# Patient Record
Sex: Male | Born: 1990 | Race: Black or African American | Hispanic: No | Smoking: Current every day smoker
Health system: Southern US, Community
[De-identification: ages and names within clinical notes are randomized; demographics above are authoritative.]

## PROBLEM LIST (undated history)

## (undated) DIAGNOSIS — J42 Unspecified chronic bronchitis: Secondary | ICD-10-CM

## (undated) DIAGNOSIS — R51 Headache: Secondary | ICD-10-CM

## (undated) DIAGNOSIS — F329 Major depressive disorder, single episode, unspecified: Secondary | ICD-10-CM

## (undated) DIAGNOSIS — F32A Depression, unspecified: Secondary | ICD-10-CM

## (undated) DIAGNOSIS — J40 Bronchitis, not specified as acute or chronic: Secondary | ICD-10-CM

## (undated) DIAGNOSIS — G43909 Migraine, unspecified, not intractable, without status migrainosus: Secondary | ICD-10-CM

## (undated) DIAGNOSIS — F4310 Post-traumatic stress disorder, unspecified: Secondary | ICD-10-CM

## (undated) DIAGNOSIS — R55 Syncope and collapse: Secondary | ICD-10-CM

## (undated) DIAGNOSIS — R519 Headache, unspecified: Secondary | ICD-10-CM

## (undated) DIAGNOSIS — I10 Essential (primary) hypertension: Secondary | ICD-10-CM

## (undated) HISTORY — PX: TONSILLECTOMY: SUR1361

## (undated) HISTORY — DX: Syncope and collapse: R55

---
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
## 2009-02-11 ENCOUNTER — Emergency Department (HOSPITAL_COMMUNITY): Admission: EM | Admit: 2009-02-11 | Discharge: 2009-02-11 | Payer: Self-pay | Admitting: Emergency Medicine

## 2010-03-16 ENCOUNTER — Emergency Department (HOSPITAL_COMMUNITY): Admission: EM | Admit: 2010-03-16 | Discharge: 2010-03-16 | Payer: Self-pay | Admitting: Emergency Medicine

## 2010-07-24 ENCOUNTER — Emergency Department (HOSPITAL_COMMUNITY)
Admission: EM | Admit: 2010-07-24 | Discharge: 2010-07-24 | Payer: Self-pay | Source: Home / Self Care | Admitting: Emergency Medicine

## 2010-10-01 ENCOUNTER — Emergency Department (HOSPITAL_COMMUNITY)
Admission: EM | Admit: 2010-10-01 | Discharge: 2010-10-01 | Payer: PRIVATE HEALTH INSURANCE | Source: Home / Self Care | Admitting: Emergency Medicine

## 2010-10-09 ENCOUNTER — Emergency Department (HOSPITAL_COMMUNITY)
Admission: EM | Admit: 2010-10-09 | Discharge: 2010-10-09 | Payer: Self-pay | Source: Home / Self Care | Admitting: Emergency Medicine

## 2011-07-05 ENCOUNTER — Inpatient Hospital Stay (INDEPENDENT_AMBULATORY_CARE_PROVIDER_SITE_OTHER)
Admission: RE | Admit: 2011-07-05 | Discharge: 2011-07-05 | Disposition: A | Discharge status: Home / Self Care | Payer: Self-pay | Source: Ambulatory Visit | Attending: Family Medicine | Admitting: Family Medicine

## 2011-07-05 DIAGNOSIS — N342 Other urethritis: Secondary | ICD-10-CM

## 2011-07-07 LAB — GC/CHLAMYDIA PROBE AMP, GENITAL
Chlamydia, DNA Probe: NEGATIVE
GC Probe Amp, Genital: POSITIVE — AB

## 2011-09-20 ENCOUNTER — Encounter: Payer: Self-pay | Admitting: Emergency Medicine

## 2011-09-20 ENCOUNTER — Emergency Department (HOSPITAL_COMMUNITY)
Admission: EM | Admit: 2011-09-20 | Discharge: 2011-09-21 | Disposition: A | Discharge status: Home / Self Care | Payer: Self-pay | Attending: Emergency Medicine | Admitting: Emergency Medicine

## 2011-09-20 DIAGNOSIS — R509 Fever, unspecified: Secondary | ICD-10-CM | POA: Insufficient documentation

## 2011-09-20 DIAGNOSIS — J039 Acute tonsillitis, unspecified: Secondary | ICD-10-CM | POA: Insufficient documentation

## 2011-09-20 DIAGNOSIS — Z79899 Other long term (current) drug therapy: Secondary | ICD-10-CM | POA: Insufficient documentation

## 2011-09-20 DIAGNOSIS — R5381 Other malaise: Secondary | ICD-10-CM | POA: Insufficient documentation

## 2011-09-20 DIAGNOSIS — R112 Nausea with vomiting, unspecified: Secondary | ICD-10-CM | POA: Insufficient documentation

## 2011-09-20 DIAGNOSIS — IMO0001 Reserved for inherently not codable concepts without codable children: Secondary | ICD-10-CM | POA: Insufficient documentation

## 2011-09-20 DIAGNOSIS — R059 Cough, unspecified: Secondary | ICD-10-CM | POA: Insufficient documentation

## 2011-09-20 DIAGNOSIS — M542 Cervicalgia: Secondary | ICD-10-CM | POA: Insufficient documentation

## 2011-09-20 DIAGNOSIS — R05 Cough: Secondary | ICD-10-CM | POA: Insufficient documentation

## 2011-09-20 DIAGNOSIS — R070 Pain in throat: Secondary | ICD-10-CM | POA: Insufficient documentation

## 2011-09-20 DIAGNOSIS — F17200 Nicotine dependence, unspecified, uncomplicated: Secondary | ICD-10-CM | POA: Insufficient documentation

## 2011-09-20 DIAGNOSIS — R5383 Other fatigue: Secondary | ICD-10-CM | POA: Insufficient documentation

## 2011-09-20 HISTORY — DX: Bronchitis, not specified as acute or chronic: J40

## 2011-09-20 NOTE — ED Notes (Signed)
Patient wearing mask (droplet precautions).

## 2011-09-20 NOTE — ED Notes (Signed)
Gave report to Chris, RN.

## 2011-09-20 NOTE — ED Notes (Signed)
C/o sore throat, productive cough with whitish-red sputum, nausea, vomiting, generalized body aches, and fever since 11am.

## 2011-09-20 NOTE — ED Notes (Signed)
Patient complaining of sore throat, nasal congestion weakness, nausea/vomiting, chills, swollen lymph nodes, and fever that started today around 1100.  Patient states that he woke up this morning with a sore throat.  Patient states that he has vomited 6 times today and is unable to keep anything today; last emesis around 2200.  Patient states that he has not had his flu shot this year; denies every having the flu in the past.  Patient states that he has been diagnosed with strep throat in the past; states that symptoms feel similar to when he was diagnosed with strep throat. Patient alert and oriented x 4; PERRL present.  Family present at bedside.  Will continue to monitor.

## 2011-09-21 MED ORDER — IBUPROFEN 800 MG PO TABS: 800.0000 mg | ORAL_TABLET | Freq: Three times a day (TID) | ORAL | Status: AC | PRN

## 2011-09-21 MED ORDER — AMOXICILLIN 500 MG PO CAPS: 500.0000 mg | ORAL_CAPSULE | Freq: Three times a day (TID) | ORAL | Status: DC

## 2011-09-21 MED ORDER — IBUPROFEN 800 MG PO TABS
800.0000 mg | ORAL_TABLET | Freq: Once | ORAL | Status: AC
  Administered 2011-09-21: 800 mg via ORAL
  Filled 2011-09-21: qty 1

## 2011-09-21 MED ORDER — AMOXICILLIN 500 MG PO CAPS
500.0000 mg | ORAL_CAPSULE | Freq: Once | ORAL | Status: AC
  Administered 2011-09-21: 500 mg via ORAL
  Filled 2011-09-21: qty 1

## 2011-09-21 MED ORDER — AMOXICILLIN 500 MG PO CAPS: 500.0000 mg | ORAL_CAPSULE | Freq: Three times a day (TID) | ORAL | Status: AC

## 2011-09-21 NOTE — ED Notes (Signed)
Pt states understanding of discharge instruction 

## 2011-09-21 NOTE — ED Provider Notes (Signed)
History     CSN: [id number]  Arrival date & time 09/20/11  2132   First MD Initiated Contact with Patient 09/20/11 2341      Chief Complaint  Patient presents with  . Influenza    (Consider location/radiation/quality/duration/timing/severity/associated sxs/prior treatment) HPI Comments: Patient with onset of fever, sore throat, mild cough and nausea with one episode of vomiting this morning. Patient denies ear pain. He denies trouble swallowing or shortness of breath.  Patient is a 20 y.o. male presenting with pharyngitis. The history is provided by the patient.  Sore Throat This is a new problem. The current episode started today. Associated symptoms include coughing, fatigue, a fever, myalgias, nausea, neck pain, a sore throat and vomiting. Pertinent negatives include no abdominal pain, chest pain, chills, congestion, headaches or rash. The symptoms are aggravated by swallowing. He has tried acetaminophen for the symptoms. The treatment provided mild relief.    Past Medical History  Diagnosis Date  . Bronchitis     History reviewed. No pertinent past surgical history.  No family history on file.  History  Substance Use Topics  . Smoking status: Current Everyday Smoker  . Smokeless tobacco: Not on file  . Alcohol Use: Yes      Review of Systems  Constitutional: Positive for fever and fatigue. Negative for chills.  HENT: Positive for sore throat and neck pain. Negative for congestion and rhinorrhea.   Eyes: Negative for redness.  Respiratory: Positive for cough. Negative for shortness of breath.   Cardiovascular: Negative for chest pain.  Gastrointestinal: Positive for nausea and vomiting. Negative for abdominal pain, diarrhea and constipation.  Genitourinary: Negative for dysuria.  Musculoskeletal: Positive for myalgias.  Skin: Negative for rash.  Neurological: Negative for dizziness and headaches.    Allergies  Review of patient's allergies indicates no known  allergies.  Home Medications   Current Outpatient Rx  Name Route Sig Dispense Refill  . ACETAMINOPHEN 500 MG PO TABS Oral Take 500 mg by mouth every 6 (six) hours as needed. For pain     . AMOXICILLIN 500 MG PO CAPS Oral Take 1 capsule (500 mg total) by mouth 3 (three) times daily. 21 capsule 0  . IBUPROFEN 800 MG PO TABS Oral Take 1 tablet (800 mg total) by mouth every 8 (eight) hours as needed for pain. 15 tablet 0    BP 127/66  Pulse 102  Temp(Src) 100 F (37.8 C) (Oral)  Resp 18  SpO2 99%  Physical Exam  Nursing note and vitals reviewed. Constitutional: He is oriented to person, place, and time. He appears well-developed and well-nourished.  HENT:  Head: Normocephalic and atraumatic.  Right Ear: Tympanic membrane and external ear normal.  Left Ear: Tympanic membrane and external ear normal.  Nose: Nose normal.  Mouth/Throat: Mucous membranes are normal. No uvula swelling. Oropharyngeal exudate, posterior oropharyngeal edema and posterior oropharyngeal erythema present. No tonsillar abscesses.  Eyes: Conjunctivae are normal. Pupils are equal, round, and reactive to light. Right eye exhibits no discharge. Left eye exhibits no discharge.  Neck: Normal range of motion. Neck supple.  Cardiovascular: Normal rate, regular rhythm and normal heart sounds.   Pulmonary/Chest: Effort normal and breath sounds normal.  Abdominal: Soft. Bowel sounds are normal. There is no tenderness. There is no rebound and no guarding.  Musculoskeletal: He exhibits no edema.  Neurological: He is alert and oriented to person, place, and time.  Skin: Skin is warm and dry.  Psychiatric: He has a normal mood and affect.  ED Course  Procedures (including critical care time)  Labs Reviewed - No data to display No results found.   1. Tonsillitis   12:23 AM patient seen and examined. 4/4 Centor criteria met. Will treat with antibiotics and ibuprofen. Patient urged to return with worsening swelling,  trouble breathing, trouble swallowing, persistent fever, or any other concerns. Patient verbalizes understanding and agrees with plan. Antibiotics given in emergency department.  MDM  Four of four CENTOR criteria met (fever, cervical adenopathy, tonsillar/pharyngeal exudate, no/mild cough).  Antibiotics prescribed.          Eustace Moore [place], [place] 09/21/11 [phone]

## 2011-09-22 NOTE — ED Provider Notes (Signed)
Medical screening examination/treatment/procedure(s) were performed by non-physician practitioner and as supervising physician I was immediately available for consultation/collaboration.   Shanikka Wonders D Axyl Sitzman, MD 09/22/11 0534 

## 2012-02-29 ENCOUNTER — Encounter (HOSPITAL_COMMUNITY): Payer: Self-pay | Admitting: *Deleted

## 2012-02-29 ENCOUNTER — Emergency Department (HOSPITAL_COMMUNITY)
Admission: EM | Admit: 2012-02-29 | Discharge: 2012-02-29 | Disposition: A | Discharge status: Home / Self Care | Payer: Self-pay | Attending: Emergency Medicine | Admitting: Emergency Medicine

## 2012-02-29 DIAGNOSIS — F17200 Nicotine dependence, unspecified, uncomplicated: Secondary | ICD-10-CM | POA: Insufficient documentation

## 2012-02-29 DIAGNOSIS — L02219 Cutaneous abscess of trunk, unspecified: Secondary | ICD-10-CM

## 2012-02-29 DIAGNOSIS — B889 Infestation, unspecified: Secondary | ICD-10-CM

## 2012-02-29 DIAGNOSIS — L03115 Cellulitis of right lower limb: Secondary | ICD-10-CM

## 2012-02-29 DIAGNOSIS — L03319 Cellulitis of trunk, unspecified: Secondary | ICD-10-CM

## 2012-02-29 MED ORDER — SULFAMETHOXAZOLE-TRIMETHOPRIM 800-160 MG PO TABS: 1.0000 | ORAL_TABLET | Freq: Two times a day (BID) | ORAL | Status: AC

## 2012-02-29 MED ORDER — DIPHENHYDRAMINE HCL 25 MG PO CAPS
25.0000 mg | ORAL_CAPSULE | Freq: Once | ORAL | Status: AC
  Administered 2012-02-29: 25 mg via ORAL
  Filled 2012-02-29: qty 1

## 2012-02-29 MED ORDER — CEPHALEXIN 500 MG PO CAPS: 500.0000 mg | ORAL_CAPSULE | Freq: Four times a day (QID) | ORAL | Status: AC

## 2012-02-29 MED ORDER — PERMETHRIN 5 % EX CREA: TOPICAL_CREAM | CUTANEOUS | Status: AC

## 2012-02-29 MED ORDER — ACETAMINOPHEN 325 MG PO TABS
650.0000 mg | ORAL_TABLET | Freq: Once | ORAL | Status: AC
  Administered 2012-02-29: 650 mg via ORAL
  Filled 2012-02-29: qty 2

## 2012-02-29 NOTE — ED Notes (Signed)
EDP at BS 

## 2012-02-29 NOTE — ED Provider Notes (Signed)
History     CSN: [id number]  Arrival date & time 02/29/12  1951   First MD Initiated Contact with Patient 02/29/12 2029      Chief Complaint  Patient presents with  . Insect Bite    multiple    HPI Pt was seen at 2040.  Per pt, c/o gradual onset and persistence of constant "rash" with "itching all over" for the past several days.  States he was staying at a hotel before the "red spots all over" began.  Endorses several "red spots" have "come to a head" and "popped."  Denies fevers, no SOB/wheezing, no sore throat.      Past Medical History  Diagnosis Date  . Bronchitis     History reviewed. No pertinent past surgical history.   History  Substance Use Topics  . Smoking status: Current Everyday Smoker  . Smokeless tobacco: Not on file  . Alcohol Use: Yes    Review of Systems ROS: Statement: All systems negative except as marked or noted in the HPI; Constitutional: Negative for fever and chills. ; ; Eyes: Negative for eye pain, redness and discharge. ; ; ENMT: Negative for ear pain, hoarseness, nasal congestion, sinus pressure and sore throat. ; ; Cardiovascular: Negative for chest pain, palpitations, diaphoresis, dyspnea and peripheral edema. ; ; Respiratory: Negative for cough, wheezing and stridor. ; ; Gastrointestinal: Negative for nausea, vomiting, diarrhea, abdominal pain, blood in stool, hematemesis, jaundice and rectal bleeding. . ; ; Genitourinary: Negative for dysuria, flank pain and hematuria. ; ; Musculoskeletal: Negative for back pain and neck pain. Negative for swelling and trauma.; ; Skin: +rash, abscess, itching. Negative for abrasions, blisters, bruising and skin lesion.; ; Neuro: Negative for headache, lightheadedness and neck stiffness. Negative for weakness, altered level of consciousness , altered mental status, extremity weakness, paresthesias, involuntary movement, seizure and syncope.     Allergies  Review of patient's allergies indicates no known  allergies.  Home Medications  No current outpatient prescriptions on file.  BP 104/79  Pulse 85  Temp(Src) 98.4 F (36.9 C) (Oral)  Resp 19  SpO2 96%  Physical Exam 2045: Physical examination:  Nursing notes reviewed; Vital signs and O2 SAT reviewed;  Constitutional: Well developed, Well nourished, Well hydrated, In no acute distress; Head:  Normocephalic, atraumatic; Eyes: EOMI, PERRL; ENMT: Mouth and pharynx normal, Mucous membranes moist; Neck: Supple, Full range of motion; Cardiovascular: Regular rate and rhythm; Respiratory: Breath sounds clear and without wheezing, resps easy, Normal respiratory effort/excursion; Chest: Nontender, no deformity; Abdomen: Soft, Nondistended; Extremities: Pulses normal, No tenderness, No edema, No calf edema or asymmetry.; Neuro: AA&Ox3, Major CN grossly intact. Speech clear, normal coordination.  No gross focal motor or sensory deficits in extremities.; Skin: Color normal, Warm, Dry; +2 abscesses approx 4cm diameter each with surrounding erythema and induration to right side of torso/flank; +multiple small erythematous insect bites to arms and legs; +indurated and erythematous approx 4cm diameter area right lateral mid-tibia without central pointing area or fluctuance.    ED Course  Procedures    MDM  MDM Reviewed: nursing note and vitals     8:49 PM:  Pt refuses I&D of 2 abscesses on his right torso/flank: both are fluctuant and have central pointing areas that are spontaneously draining purulent fluid.  Right tibial erythematous area is not fluctuant, does not have a central pointing area.  Long d/w pt regarding abscess drainage.  Pt continues to refuse I&D, stating that he "just wants to try some abx first."  Pt  makes his own medical decisions.  Pt is agreeable to take both abx as rx, apply warm compresses to the areas of induration/abscess to promote further drainage, and return in 2 days for wound check; sooner if worsening.  Pt is also scratching  his arms and legs, has had exposure to possible infestation after staying at a hotel; will tx with scabicide.         Laray Anger, DO 03/04/12 1054

## 2012-02-29 NOTE — ED Notes (Addendum)
C/o multiple skin bites. Pinpoints to R flank, R index finger, R shin & L FA & hand. Mentions popping some of them, some drainage, itching, pain & redness. Was staying in a hotel, now at home. Denies fever. Mentions nausea.

## 2012-02-29 NOTE — Discharge Instructions (Signed)
RESOURCE GUIDE  Chronic Pain Problems: Contact [facility]  [phone] Patients need to be referred by their primary care doctor.  Insufficient Money for Medicine: Contact United Way:  call "211" or [facility] [phone].  No Primary Care Doctor: - Call [facility]  [phone] - can help you locate a primary care doctor that  accepts your insurance, provides certain services, etc. - Physician Referral Service- 1-800-533-3463  Agencies that provide inexpensive medical care: - [facility]  [phone] - [facility]  [phone] - [facility]  [phone] - [facility] Clinic  [phone] - Planned Parenthood  [phone] - [facility]  [phone]  Medicaid-accepting [place] Providers: - [facility]- [address]  [phone], Mon-Fri 9am-7pm, Sat 9am-1pm - [facility]- [address], [address]  [phone] - [facility]- [address], [address]  [phone] - [facility]- [address] [place] [address]  [phone] - Ve[facility]- [address], [address], [phone]  Only accepts [place] Access Medicaid patients after they have their name  applied to their card  Self Pay (no insurance) in [place]: - Sickle Cell Patients: Dr Eric Dean, [facility]  [address], [phone] - [facility]- [address]  [phone]       -     [facility] [place]- [address]       -     [facility]- see information above (Speak to Pam H if you do not have insurance)       -  [facility]- [address], [phone]       -  [facility] [place]- [address],  [phone]       -  [facility]- [address] [place] [address], [phone]       -  Dr Osei-Bonsu-  [address], [place], [phone]       -  [facility]- [address], [phone]       -  [facility]- [address] [place] [address], [phone], also [address], [phone]       -    [facility]- [address], [phone], 1st & 3rd Saturday   every month, 10am-1pm  1) Find a Doctor and Pay Out of Pocket Although you won't have to find out who is covered by your insurance plan, it is a good idea to ask around and get recommendations. You will then need to call the office and see if the doctor you have chosen will accept you as a new patient and what types of options they offer for patients who are self-pay. Some doctors offer discounts or will set up payment plans for their patients who do not have insurance, but you will need to ask so you aren't surprised when you get to your appointment.  2) Contact Your Local Health Department Not all health departments have doctors that can see patients for sick visits, but many do, so it is worth a call to see if yours does. If you don't know where your local health department is, you can check in your phone book. The CDC also has a tool to help you locate your state's health department, and many state websites also have   listings of all of their local health departments.  3) Find a Walk-in Clinic If your illness is not likely to be very severe or complicated, you may want to try a walk in clinic. These are popping up all over the country in pharmacies, drugstores, and shopping centers. They're usually staffed by nurse practitioners or physician assistants that have been trained to treat common illnesses and complaints. They're usually fairly quick and inexpensive. However, if you have serious medical issues or chronic medical problems, these are probably not your best option  STD Testing - [place] [facility] [place], [facility] Clinic, [address], [place], phone [phone] or 1-877-539-9860.  Monday - Friday, call for an appointment. - [place]  [facility] [place], [facility] Clinic, [address], [place], phone [phone] or 1-877-539-9860.  Monday - Friday, call for an appointment.  Abuse/Neglect: - [place] Child Abuse Hotline (336) 641-3795 - [place] Child Abuse Hotline 800-378-5315 (After Hours)  Emergency Shelter:  [facility] (336) 271-5985  Maternity Homes: - [facility] (336) 275-9566 - [facility] (704) 372-4663  MRSA Hotline #:   832-7006  [place] Resources  [facility] [place]  [employer] [place] [facility] [address]                 [address]         [address]  [place]                                               [place]                              [place] Phone:  [phone]                                  Phone:  [phone]                   Phone:  [phone]  [place] [facility], [phone] - [place] Services - [facility]- 1-888-581-9988       -     [facility] in [place], [address],                                  336-349-4454, Insurance  [place] Child Abuse Hotline (336) 342-1394 or (336) 342-3537 (After Hours)   Behavioral Health Services  Substance Abuse Resources: - [facility]  336-882-2125 - [facility] 336-784-9470 - [facility] 336-285-9073 - [facility] 336-845-3988 - Residential & Outpatient Substance Abuse Program  800-659-3381  Psychological Services: -  Health  [phone] - [facility]  [phone] - [place] [facility], [address], [place], ACCESS LINE: 1-800-853-5163 or 336-641-4981, Http://www.[facility].com/services/adult.htm  Dental Assistance  If unable to pay or uninsured, contact:  [facility] or [place] [facility] to become qualified for the adult dental  clinic.  Patients with Medicaid:  [facility] [address], [phone], [phone]  If unable   to pay, or uninsured, contact [facility] 925-864-4198) or [facility] Department (845)199-1864 in [place], [phone] in [facility] to become qualified for the adult dental clinic  Other Low-Cost Community Dental Services: - [facility]- [address] [place], [place], [place], [address], [phone], [address], 2nd and 4th Thursday of the month at 6:30am.  10 clients each day by appointment, can sometimes see walk-in patients if someone does not show for an appointment. [facility]- [address] [place], [place], [address], [phone] - [facility]- [address], [place], [place], [address], [phone] [facility]- [phone] [facility]- [phone] [facility] [phone]     Take the prescriptions as directed.  Apply moist heat to the area(s) of redness, for 15 minutes at a time, several times per day for the next few days.  Do not fall asleep on a heating pack.  It is also ok to shower and/or sit in a warm tub of water, this may help ease some of your discomfort and encourage the area to drain.  Call your regular medical doctor tomorrow morning to schedule a follow up appointment within the next 48 hours.  If your family doctor cannot see you, return to the Emergency Department within 48 hours for a re-check of the area to make sure you are improving.  Return to the Emergency Department immediately sooner if worsening.

## 2013-06-23 ENCOUNTER — Emergency Department (HOSPITAL_COMMUNITY)
Admission: EM | Admit: 2013-06-23 | Discharge: 2013-06-23 | Disposition: A | Discharge status: Home / Self Care | Payer: 59 | Attending: Emergency Medicine | Admitting: Emergency Medicine

## 2013-06-23 ENCOUNTER — Encounter (HOSPITAL_COMMUNITY): Payer: Self-pay | Admitting: Emergency Medicine

## 2013-06-23 DIAGNOSIS — K0889 Other specified disorders of teeth and supporting structures: Secondary | ICD-10-CM

## 2013-06-23 DIAGNOSIS — F17200 Nicotine dependence, unspecified, uncomplicated: Secondary | ICD-10-CM | POA: Insufficient documentation

## 2013-06-23 DIAGNOSIS — K089 Disorder of teeth and supporting structures, unspecified: Secondary | ICD-10-CM | POA: Insufficient documentation

## 2013-06-23 DIAGNOSIS — Z8709 Personal history of other diseases of the respiratory system: Secondary | ICD-10-CM | POA: Insufficient documentation

## 2013-06-23 MED ORDER — PENICILLIN V POTASSIUM 500 MG PO TABS: 500.0000 mg | ORAL_TABLET | Freq: Four times a day (QID) | ORAL | Status: DC

## 2013-06-23 MED ORDER — HYDROCODONE-ACETAMINOPHEN 5-325 MG PO TABS
2.0000 | ORAL_TABLET | Freq: Once | ORAL | Status: AC
  Administered 2013-06-23: 2 via ORAL
  Filled 2013-06-23: qty 2

## 2013-06-23 MED ORDER — HYDROCODONE-ACETAMINOPHEN 5-325 MG PO TABS: 1.0000 | ORAL_TABLET | Freq: Three times a day (TID) | ORAL | Status: DC | PRN

## 2013-06-23 NOTE — ED Notes (Signed)
Pt has ride home with mother

## 2013-06-23 NOTE — ED Provider Notes (Signed)
Medical screening examination/treatment/procedure(s) were performed by non-physician practitioner and as supervising physician I was immediately available for consultation/collaboration.  Najat Olazabal M Kosha Jaquith, MD 06/23/13 0527 

## 2013-06-23 NOTE — ED Notes (Signed)
PA at bedside.

## 2013-06-23 NOTE — ED Provider Notes (Signed)
CSN: [id number]     Arrival date & time 06/23/13  0058 History   First MD Initiated Contact with Patient 06/23/13 0108     Chief Complaint  Patient presents with  . Dental Pain   (Consider location/radiation/quality/duration/timing/severity/associated sxs/prior Treatment) HPI Comments: Patient is a 22 year old male who presents for intermittent dental pain x1 month which has been constant for the last week. Patient states the pain is sharp, throbbing, and aching in nature and radiating to his left ear. Patient states the pain is worse with eating and eating or drinking cold foods/liquids. Patient has tried naproxen for pain with moderate relief. He denies associated fever, ear discharge, inability to swallow, drooling, neck pain or stiffness, and shortness of breath.  Patient is a 21 y.o. male presenting with tooth pain. The history is provided by the patient. No language interpreter was used.  Dental Pain   Past Medical History  Diagnosis Date  . Bronchitis    History reviewed. No pertinent past surgical history. No family history on file. History  Substance Use Topics  . Smoking status: Current Every Day Smoker  . Smokeless tobacco: Not on file  . Alcohol Use: Yes    Review of Systems  HENT: Positive for dental problem.   All other systems reviewed and are negative.    Allergies  Review of patient's allergies indicates no known allergies.  Home Medications   Current Outpatient Rx  Name  Route  Sig  Dispense  Refill  . benzocaine (ORAJEL) 10 % mucosal gel   Mouth/Throat   Use as directed 1 application in the mouth or throat as needed for pain.         Marland Kitchen ibuprofen (ADVIL,MOTRIN) 200 MG tablet   Oral   Take 400 mg by mouth daily as needed for pain.         . naproxen sodium (ALEVE) 220 MG tablet   Oral   Take 440 mg by mouth daily as needed (pain).         Marland Kitchen HYDROcodone-acetaminophen (NORCO/VICODIN) 5-325 MG per tablet   Oral   Take 1 tablet by mouth every 8  (eight) hours as needed for pain.   7 tablet   0   . penicillin v potassium (VEETID) 500 MG tablet   Oral   Take 1 tablet (500 mg total) by mouth 4 (four) times daily.   40 tablet   0    BP 138/85  Pulse 83  Temp(Src) 98.3 F (36.8 C) (Oral)  Resp 18  SpO2 98%  Physical Exam  Nursing note and vitals reviewed. Constitutional: He is oriented to person, place, and time. He appears well-developed and well-nourished. No distress.  HENT:  Head: Normocephalic and atraumatic.  Right Ear: Tympanic membrane, external ear and ear canal normal. No mastoid tenderness.  Left Ear: Tympanic membrane, external ear and ear canal normal. No mastoid tenderness.  Nose: Nose normal.  Mouth/Throat: Uvula is midline, oropharynx is clear and moist and mucous membranes are normal. No oral lesions. No trismus in the jaw. Abnormal dentition. Dental caries present. No dental abscesses or edematous.    No gingival swelling or area of fluctuance to suspect drainable dental abscess. No oral bleeding or lesions. Airway patent and patient tolerating secretions without difficulty. Uvula midline without evidence of peritonsillar abscess.  Eyes: Conjunctivae and EOM are normal. No scleral icterus.  Neck: Normal range of motion.  Pulmonary/Chest: Effort normal. No respiratory distress.  Musculoskeletal: Normal range of motion.  Neurological: He is  alert and oriented to person, place, and time.  Skin: Skin is warm and dry. No rash noted. He is not diaphoretic. No erythema. No pallor.  Psychiatric: He has a normal mood and affect. His behavior is normal.    ED Course  Procedures (including critical care time) Labs Review Labs Reviewed - No data to display Imaging Review No results found.  MDM   1. Pain, dental    Uncomplicated dental pain. Patient is well and nontoxic appearing, hemodynamically stable, and afebrile. Airway patent and patient tolerating secretions without difficulty. No area of fluctuance  to suspect drainable dental abscess. Uvula midline. There is no trismus or stridor. Patient appropriate for discharge with dental follow up; reliable for f/u at scheduled appt on Tuesday (06/27/2013). Patient given prescription for penicillin to cover for infection and Norco to take as needed for severe pain. Ibuprofen or naproxen recommended for mild to moderate pain. Return precautions discussed and patient agreeable to plan with no unaddressed concerns.    Antony Madura, PA-C 06/23/13 0145

## 2013-06-23 NOTE — ED Notes (Signed)
Pt. reports left upper molar pain with cavity for several weeks unrelieved by OTC pain medication .

## 2013-07-01 ENCOUNTER — Encounter (HOSPITAL_COMMUNITY): Payer: Self-pay | Admitting: Emergency Medicine

## 2013-07-01 ENCOUNTER — Emergency Department (HOSPITAL_COMMUNITY)
Admission: EM | Admit: 2013-07-01 | Discharge: 2013-07-01 | Disposition: A | Discharge status: Home / Self Care | Payer: 59 | Attending: Emergency Medicine | Admitting: Emergency Medicine

## 2013-07-01 DIAGNOSIS — K089 Disorder of teeth and supporting structures, unspecified: Secondary | ICD-10-CM | POA: Insufficient documentation

## 2013-07-01 DIAGNOSIS — K029 Dental caries, unspecified: Secondary | ICD-10-CM | POA: Insufficient documentation

## 2013-07-01 DIAGNOSIS — Z8709 Personal history of other diseases of the respiratory system: Secondary | ICD-10-CM | POA: Insufficient documentation

## 2013-07-01 DIAGNOSIS — Z792 Long term (current) use of antibiotics: Secondary | ICD-10-CM | POA: Insufficient documentation

## 2013-07-01 DIAGNOSIS — R220 Localized swelling, mass and lump, head: Secondary | ICD-10-CM | POA: Insufficient documentation

## 2013-07-01 DIAGNOSIS — K0889 Other specified disorders of teeth and supporting structures: Secondary | ICD-10-CM

## 2013-07-01 DIAGNOSIS — F17200 Nicotine dependence, unspecified, uncomplicated: Secondary | ICD-10-CM | POA: Insufficient documentation

## 2013-07-01 MED ORDER — OXYCODONE-ACETAMINOPHEN 5-325 MG PO TABS: 1.0000 | ORAL_TABLET | Freq: Four times a day (QID) | ORAL | Status: DC | PRN

## 2013-07-01 MED ORDER — OXYCODONE-ACETAMINOPHEN 5-325 MG PO TABS
1.0000 | ORAL_TABLET | Freq: Once | ORAL | Status: AC
  Administered 2013-07-01: 1 via ORAL
  Filled 2013-07-01: qty 1

## 2013-07-01 MED ORDER — BUPIVACAINE-EPINEPHRINE PF 0.5-1:200000 % IJ SOLN
1.8000 mL | Freq: Once | INTRAMUSCULAR | Status: AC
  Administered 2013-07-01: 9 mg
  Filled 2013-07-01: qty 1.8

## 2013-07-01 NOTE — ED Notes (Signed)
Pt reports left upper toothache x 1 month. Pt tried ibuprofen PCN hydrocodone today without relief.

## 2013-07-01 NOTE — ED Provider Notes (Signed)
CSN: [id number]     Arrival date & time 07/01/13  1717 History  This chart was scribed for non-physician practitioner, Rhea Bleacher, PA-C working with Flint Melter, MD by Greggory Stallion, ED scribe. This patient was seen in room TR05C/TR05C and the patient's care was started at 5:36 PM.   Chief Complaint  Patient presents with  . Dental Pain    Top left   The history is provided by the patient. No language interpreter was used.    HPI Comments: Derek Sullivan is a 22 y.o. male who presents to the Emergency Department complaining of gradual onset, constant left upper dental pain that started 1 month ago. He states he has mild neck swelling. Pt was seen here about one week ago and given hydrocodone with no relief and penicillin. He has also taken ibuprofen with no relief and Orajel with temporary relief. Pt went to the dentist and was told he needed his tooth pulled but he is not able to afford it yet.   Past Medical History  Diagnosis Date  . Bronchitis    History reviewed. No pertinent past surgical history. No family history on file. History  Substance Use Topics  . Smoking status: Current Every Day Smoker  . Smokeless tobacco: Not on file  . Alcohol Use: Yes    Review of Systems  Constitutional: Negative for fever.  HENT: Positive for dental problem. Negative for ear pain, facial swelling, sore throat and trouble swallowing.   Respiratory: Negative for shortness of breath and stridor.   Musculoskeletal: Negative for neck pain.  Skin: Negative for color change.  Neurological: Negative for headaches.    Allergies  Review of patient's allergies indicates no known allergies.  Home Medications   Current Outpatient Rx  Name  Route  Sig  Dispense  Refill  . benzocaine (ORAJEL) 10 % mucosal gel   Mouth/Throat   Use as directed 1 application in the mouth or throat as needed for pain.         Marland Kitchen HYDROcodone-acetaminophen (NORCO/VICODIN) 5-325 MG per tablet   Oral   Take 1  tablet by mouth every 8 (eight) hours as needed for pain.   7 tablet   0   . ibuprofen (ADVIL,MOTRIN) 200 MG tablet   Oral   Take 400 mg by mouth daily as needed for pain.         . naproxen sodium (ALEVE) 220 MG tablet   Oral   Take 440 mg by mouth daily as needed (pain).         Marland Kitchen penicillin v potassium (VEETID) 500 MG tablet   Oral   Take 1 tablet (500 mg total) by mouth 4 (four) times daily.   40 tablet   0    BP 160/100  Pulse 74  Temp(Src) 97.5 F (36.4 C) (Oral)  Resp 24  Ht 6\' 4"  (1.93 m)  Wt 275 lb (124.739 kg)  BMI 33.49 kg/m2  SpO2 97%  Physical Exam  Nursing note and vitals reviewed. Constitutional: He appears well-developed and well-nourished.  HENT:  Head: Normocephalic and atraumatic.  Right Ear: Tympanic membrane, external ear and ear canal normal.  Left Ear: Tympanic membrane, external ear and ear canal normal.  Nose: Nose normal.  Mouth/Throat: Uvula is midline, oropharynx is clear and moist and mucous membranes are normal. No trismus in the jaw. Abnormal dentition. Dental caries present. No dental abscesses or uvula swelling. No tonsillar abscesses.  Patient with advanced periodontal disease with multiple caries  and missing teeth. There is a small fracture to the left maxillary third molar. Mild erythema of gums without palpable abscess.   Eyes: Pupils are equal, round, and reactive to light.  Neck: Normal range of motion. Neck supple.  No neck swelling or Lugwig's angina  Neurological: He is alert.  Skin: Skin is warm and dry.  Psychiatric: He has a normal mood and affect.    ED Course  Procedures (including critical care time)  DIAGNOSTIC STUDIES: Oxygen Saturation is 97% on RA, normal by my interpretation.    COORDINATION OF CARE: 5:41 PM-Discussed treatment plan which includes a different pain medication and a dental block with pt at bedside and pt agreed to plan. Advised pt to follow up with a dentist as soon as he can.   Labs  Review Labs Reviewed - No data to display Imaging Review No results found.  EKG Interpretation   None      Dental block was performed. 1.70mL of 2% lidocaine with epi was combined with 1.23mL 0.5% bupivacaine with epi and an alveolar block was performed. Injections made at base of tooth as well as the tooth immediately anterior and space posterior to tooth. Adequate anesthesia was obtained. Minimal bleeding after injections. Patient tolerated procedure well with no immediate complications.   Patient counseled on use of narcotic pain medications. Counseled not to combine these medications with others containing tylenol. Urged not to drink alcohol, drive, or perform any other activities that requires focus while taking these medications. The patient verbalizes understanding and agrees with the plan.   MDM   1. Pain, dental    Patient with toothache.  No gross abscess.  Exam unconcerning for Ludwig's angina or other deep tissue infection in neck.  Will treat with pain medicine.  Pt to continue penicillin. Urged patient to follow-up with dentist.    I personally performed the services described in this documentation, which was scribed in my presence. The recorded information has been reviewed and is accurate.   Renne Crigler, PA-C 07/01/13 2154

## 2013-07-02 NOTE — ED Provider Notes (Signed)
Medical screening examination/treatment/procedure(s) were performed by non-physician practitioner and as supervising physician I was immediately available for consultation/collaboration.  Flint Melter, MD 07/02/13 [phone]

## 2013-11-01 ENCOUNTER — Encounter (HOSPITAL_COMMUNITY): Payer: Self-pay | Admitting: Emergency Medicine

## 2013-11-01 ENCOUNTER — Emergency Department (HOSPITAL_COMMUNITY)
Admission: EM | Admit: 2013-11-01 | Discharge: 2013-11-01 | Disposition: A | Discharge status: Home / Self Care | Payer: 59 | Attending: Emergency Medicine | Admitting: Emergency Medicine

## 2013-11-01 DIAGNOSIS — R112 Nausea with vomiting, unspecified: Secondary | ICD-10-CM | POA: Insufficient documentation

## 2013-11-01 DIAGNOSIS — R109 Unspecified abdominal pain: Secondary | ICD-10-CM | POA: Insufficient documentation

## 2013-11-01 DIAGNOSIS — Z8709 Personal history of other diseases of the respiratory system: Secondary | ICD-10-CM | POA: Insufficient documentation

## 2013-11-01 DIAGNOSIS — R51 Headache: Secondary | ICD-10-CM | POA: Insufficient documentation

## 2013-11-01 DIAGNOSIS — F17200 Nicotine dependence, unspecified, uncomplicated: Secondary | ICD-10-CM | POA: Insufficient documentation

## 2013-11-01 DIAGNOSIS — R519 Headache, unspecified: Secondary | ICD-10-CM

## 2013-11-01 MED ORDER — KETOROLAC TROMETHAMINE 30 MG/ML IJ SOLN
30.0000 mg | Freq: Once | INTRAMUSCULAR | Status: AC
  Administered 2013-11-01: 30 mg via INTRAVENOUS
  Filled 2013-11-01: qty 1

## 2013-11-01 MED ORDER — METOCLOPRAMIDE HCL 5 MG/ML IJ SOLN
10.0000 mg | Freq: Once | INTRAMUSCULAR | Status: AC
  Administered 2013-11-01: 10 mg via INTRAVENOUS
  Filled 2013-11-01: qty 2

## 2013-11-01 MED ORDER — DIPHENHYDRAMINE HCL 50 MG/ML IJ SOLN
12.5000 mg | Freq: Once | INTRAMUSCULAR | Status: AC
  Administered 2013-11-01: 12.5 mg via INTRAVENOUS
  Filled 2013-11-01: qty 1

## 2013-11-01 MED ORDER — SODIUM CHLORIDE 0.9 % IV BOLUS (SEPSIS)
1000.0000 mL | Freq: Once | INTRAVENOUS | Status: AC
  Administered 2013-11-01: 1000 mL via INTRAVENOUS

## 2013-11-01 NOTE — ED Provider Notes (Signed)
Medical screening examination/treatment/procedure(s) were performed by non-physician practitioner and as supervising physician I was immediately available for consultation/collaboration.  EKG Interpretation   None         Gilda Creasehristopher J. Zamiah Tollett, MD 11/01/13 1048

## 2013-11-01 NOTE — ED Provider Notes (Signed)
CSN: [id number]     Arrival date & time 11/01/13  [id number] History   First MD Initiated Contact with Patient 11/01/13 0825     Chief Complaint  Patient presents with  . Emesis  . Headache  . Abdominal Pain     (Consider location/radiation/quality/duration/timing/severity/associated sxs/prior Treatment) Patient is a 23 y.o. male presenting with headaches and abdominal pain. The history is provided by the patient. No language interpreter was used.  Headache Pain location:  R parietal Associated symptoms: abdominal pain, nausea and vomiting   Associated symptoms: no congestion, no fatigue, no fever, no myalgias, no sinus pressure and no sore throat   Associated symptoms comment:  Right sided headache since earlier this morning that "feels better if I push on it". He reports history of headache but not this painful. It is throbbing and causes him to have nausea and vomiting. No fever. No significant sinus congestion, sore throat or cough. He states his abdomen hurts "because of the vomiting." He has had one episode of diarrhea.  Abdominal Pain Associated symptoms: nausea and vomiting   Associated symptoms: no fatigue, no fever and no sore throat     Past Medical History  Diagnosis Date  . Bronchitis    No past surgical history on file. No family history on file. History  Substance Use Topics  . Smoking status: Current Every Day Smoker -- 0.50 packs/day    Types: Cigarettes  . Smokeless tobacco: Not on file  . Alcohol Use: Yes     Comment: occasional     Review of Systems  Constitutional: Negative for fever and fatigue.  HENT: Negative for congestion, sinus pressure and sore throat.   Gastrointestinal: Positive for nausea, vomiting and abdominal pain.  Genitourinary: Negative.   Musculoskeletal: Negative for myalgias.  Neurological: Positive for headaches.      Allergies  Review of patient's allergies indicates no known allergies.  Home Medications   Current Outpatient Rx   Name  Route  Sig  Dispense  Refill  . ibuprofen (ADVIL,MOTRIN) 200 MG tablet   Oral   Take 400 mg by mouth every 6 (six) hours as needed for fever or headache.          BP 141/94  Pulse 72  Temp(Src) 97.8 F (36.6 C) (Oral)  Resp 20  SpO2 100% Physical Exam  Constitutional: He is oriented to person, place, and time. He appears well-developed and well-nourished.  HENT:  Head: Normocephalic and atraumatic.  Eyes: EOM are normal. Pupils are equal, round, and reactive to light.  Neck: Normal range of motion.  Cardiovascular: Normal rate and regular rhythm.   No murmur heard. Pulmonary/Chest: Effort normal and breath sounds normal. He has no wheezes. He has no rales.  Abdominal: Soft. There is no tenderness.  Musculoskeletal: Normal range of motion.  Neurological: He is alert and oriented to person, place, and time. He has normal strength and normal reflexes. No sensory deficit. He displays a negative Romberg sign. Coordination normal.  Skin: Skin is warm and dry.  Psychiatric: He has a normal mood and affect.    ED Course  Procedures (including critical care time) Labs Review Labs Reviewed - No data to display Imaging Review No results found.  EKG Interpretation   None       MDM   Final diagnoses:  None    1. Headache  HA pain is resolved with medications. Non-focal neurologic exam. Reassessment: patient is sleeping, easily awakened. No vomiting in ED. VSS. He is felt  stable to discharge home.     Arnoldo Hooker, PA-C 11/01/13 1046

## 2013-11-01 NOTE — Discharge Instructions (Signed)

## 2013-11-01 NOTE — ED Notes (Signed)
Pt c/o right sided forehead pain, abd pain, and n/v/d. Symptoms started this morning around 0600. Stated that abdominal pain "is more throwing up pain more that anything". Denies any chills. Stated that he had diarrhea x1 and vomited x 2.

## 2013-12-10 ENCOUNTER — Encounter (HOSPITAL_COMMUNITY): Payer: Self-pay | Admitting: Emergency Medicine

## 2013-12-10 ENCOUNTER — Emergency Department (HOSPITAL_COMMUNITY)
Admission: EM | Admit: 2013-12-10 | Discharge: 2013-12-11 | Disposition: A | Discharge status: Home / Self Care | Payer: 59 | Attending: Emergency Medicine | Admitting: Emergency Medicine

## 2013-12-10 DIAGNOSIS — F17200 Nicotine dependence, unspecified, uncomplicated: Secondary | ICD-10-CM | POA: Insufficient documentation

## 2013-12-10 DIAGNOSIS — Z8709 Personal history of other diseases of the respiratory system: Secondary | ICD-10-CM | POA: Insufficient documentation

## 2013-12-10 DIAGNOSIS — L03019 Cellulitis of unspecified finger: Principal | ICD-10-CM | POA: Insufficient documentation

## 2013-12-10 DIAGNOSIS — L02519 Cutaneous abscess of unspecified hand: Secondary | ICD-10-CM | POA: Insufficient documentation

## 2013-12-10 DIAGNOSIS — L03011 Cellulitis of right finger: Secondary | ICD-10-CM

## 2013-12-10 DIAGNOSIS — Z23 Encounter for immunization: Secondary | ICD-10-CM | POA: Insufficient documentation

## 2013-12-10 NOTE — ED Notes (Addendum)
C/o R ring finger redness, swelling. Onset 3d ago. Started as itching, now gradually progressively worse. Reports pain, throbbing itching redness and scant serous drainage. Not sure if it started as a bite or ingrown hair. "Have been squeezing it".

## 2013-12-11 MED ORDER — TETANUS-DIPHTH-ACELL PERTUSSIS 5-2.5-18.5 LF-MCG/0.5 IM SUSP
0.5000 mL | Freq: Once | INTRAMUSCULAR | Status: AC
  Administered 2013-12-11: 0.5 mL via INTRAMUSCULAR
  Filled 2013-12-11: qty 0.5

## 2013-12-11 MED ORDER — ACETAMINOPHEN 325 MG PO TABS
650.0000 mg | ORAL_TABLET | Freq: Once | ORAL | Status: AC
  Administered 2013-12-11: 650 mg via ORAL
  Filled 2013-12-11: qty 2

## 2013-12-11 MED ORDER — DOXYCYCLINE HYCLATE 100 MG PO CAPS: 100.0000 mg | ORAL_CAPSULE | Freq: Two times a day (BID) | ORAL | Status: DC

## 2013-12-11 NOTE — ED Notes (Signed)
Right ring finger between PIP and MIP red extending up onto hand volar aspect.  Small amount serous drainage noted.  Pt denies n/t distally.

## 2013-12-11 NOTE — ED Provider Notes (Signed)
CSN: [id number]     Arrival date & time 12/10/13  2321 History   First MD Initiated Contact with Patient 12/10/13 2339     Chief Complaint  Patient presents with  . Cellulitis  . Hand Pain    HPI  Derek Sullivan is a 23 y.o. male with a PMH of bronchitis and hx of abscesses who presents to the ED for evaluation of cellulitis and hand pain. History was provided by the patient. Patient states he developed an "ingrown hair" or a "bug bite" to the right 4th finger 3 days ago. Patient complains of associated itching and constant throbbing pain. He states he has been scratching the area. Over the past three days he has noticed progressively worsening swelling and redness to the area. He tried draining the area using a pin with a small amount of purulent material and bloody discharge. He has been cleaning the area daily. He has a hx of abscesses in the past, however, not in the same location. Patient denies any trauma/injuries. No weakness, numbness/tingling, loss of sensation, fever, chills, fatigue, nausea, or emesis. No hx of diabetes. Patient is unsure of tetanus status.    Past Medical History  Diagnosis Date  . Bronchitis    History reviewed. No pertinent past surgical history. No family history on file. History  Substance Use Topics  . Smoking status: Current Every Day Smoker -- 0.50 packs/day    Types: Cigarettes  . Smokeless tobacco: Not on file  . Alcohol Use: Yes     Comment: occasional     Review of Systems  Constitutional: Negative for fever, chills, diaphoresis, activity change, appetite change and fatigue.  Gastrointestinal: Negative for nausea and vomiting.  Musculoskeletal: Positive for joint swelling. Negative for arthralgias and myalgias.  Skin: Positive for color change and wound. Negative for rash.  Neurological: Negative for weakness and numbness.    Allergies  Review of patient's allergies indicates no known allergies.  Home Medications   Current Outpatient  Rx  Name  Route  Sig  Dispense  Refill  . acetaminophen (TYLENOL) 500 MG tablet   Oral   Take 500 mg by mouth every 6 (six) hours as needed for moderate pain.          BP 133/81  Pulse 81  Temp(Src) 97.9 F (36.6 C) (Oral)  Resp 16  SpO2 100%  Filed Vitals:   12/10/13 2324 12/11/13 0044 12/11/13 0200  BP: 153/85 133/81 146/91  Pulse: 83 81 89  Temp: 98.5 F (36.9 C) 97.9 F (36.6 C) 98.6 F (37 C)  TempSrc: Oral Oral Oral  Resp: 18 16 18   SpO2: 98% 100% 99%    Physical Exam  Nursing note and vitals reviewed. Constitutional: He is oriented to person, place, and time. He appears well-developed and well-nourished. No distress.  HENT:  Head: Normocephalic and atraumatic.  Right Ear: External ear normal.  Left Ear: External ear normal.  Eyes: Conjunctivae are normal. Right eye exhibits no discharge. Left eye exhibits no discharge.  Neck: Normal range of motion. Neck supple.  Cardiovascular: Normal rate.   Pulmonary/Chest: Effort normal.  Abdominal: Soft. He exhibits no distension. There is no tenderness.  Musculoskeletal: Normal range of motion. He exhibits edema and tenderness.       Hands: No pain with passive ROM of the right 4th digit. No limitations with ROM of the right 4th digit.   Neurological: He is alert and oriented to person, place, and time.  Sensation intact in the  right hand throughout.   Skin: Skin is warm and dry. He is not diaphoretic.  Pinpoint open laceration to the middle of the right 4th proximal phalanx with no bleeding/drainage. Localized associated edema and erythema to the right middle posterior phalanx. No circumferential edema or erythema. Mild edema to the MP joint of the right 4th digit with no erythema. Right 4th proximal phalanx is tender to palpation.     ED Course  INCISION AND DRAINAGE Date/Time: 12/11/2013 1:50 AM Performed by: Coral CeoPALMER, Reita Shindler K Authorized by: Jillyn LedgerPALMER, Hisako Bugh K Consent: Verbal consent obtained. Consent given by:  patient Patient identity confirmed: verbally with patient Body area: upper extremity Location details: right ring finger Anesthesia: local infiltration Local anesthetic: lidocaine 2% without epinephrine Anesthetic total: 1 ml Patient sedated: no Risk factor: underlying major vessel Scalpel size: 11 Incision type: single straight Complexity: simple Drainage: purulent and  serosanguinous Drainage amount: scant Wound treatment: wound left open Packing material: none Patient tolerance: Patient tolerated the procedure well with no immediate complications. Comments: Antibiotic ointment and bandage applied   (including critical care time) Labs Review Labs Reviewed - No data to display Imaging Review No results found.   EKG Interpretation None      MDM   Derek Sullivan is a 23 y.o. male with a PMH of bronchitis and abscesses who presents to the ED for evaluation of cellulitis and hand pain.    Rechecks  2:00 AM = Spoke with patient regarding plan. Tylenol ordered for pain.    Consults  2:00 AM = Spoke with Dr. Mina MarbleWeingold who would like patient started on doxycycline. Will see in clinic on Wednesday.    Etiology of finger pain likely due to developing cellulitis vs developing superficial abscess. Area I&D'd in the ED with minimal purulent and serosanguinous drainage. Tetanus booster given. No vesicles to suggest Herpetic Whitlow. Doubt tenosynovitis or septic joint. Patient afebrile and non-toxic in appearance. RICE method discussed with patient. Hand surgery consulted who will follow-up with patient this week for re-check. Patient discharged on doxycycline. Return precautions, discharge instructions, and follow-up was discussed with the patient before discharge.      Discharge Medication List as of 12/11/2013  1:50 AM    START taking these medications   Details  doxycycline (VIBRAMYCIN) 100 MG capsule Take 1 capsule (100 mg total) by mouth 2 (two) times daily., Starting  12/11/2013, Until Discontinued, Print        Final impressions: 1. Cellulitis of finger, right       Luiz IronJessica Katlin Hollin Crewe PA-C   This patient was discussed with Dr. Christin Fudgepitz         Matthieu Loftus K Konica Stankowski, PA-C 12/11/13 [phone]

## 2013-12-11 NOTE — Discharge Instructions (Signed)
Keep wound clean and dry  Elevate hand to help reduce swelling  Take Ibuprofen as needed for pain  Take antibiotics for full dose Follow-up with hand surgery for wound re-check on Wednesday Return to the emergency department if you develop any changing/worsening condition, increased drainage, spreading redness/swelling, fever, or any other concerns (please read additional information regarding your condition below)   Cellulitis Cellulitis is an infection of the skin and the tissue beneath it. The infected area is usually red and tender. Cellulitis occurs most often in the arms and lower legs.  CAUSES  Cellulitis is caused by bacteria that enter the skin through cracks or cuts in the skin. The most common types of bacteria that cause cellulitis are Staphylococcus and Streptococcus. SYMPTOMS   Redness and warmth.  Swelling.  Tenderness or pain.  Fever. DIAGNOSIS  Your caregiver can usually determine what is wrong based on a physical exam. Blood tests may also be done. TREATMENT  Treatment usually involves taking an antibiotic medicine. HOME CARE INSTRUCTIONS   Take your antibiotics as directed. Finish them even if you start to feel better.  Keep the infected arm or leg elevated to reduce swelling.  Apply a warm cloth to the affected area up to 4 times per day to relieve pain.  Only take over-the-counter or prescription medicines for pain, discomfort, or fever as directed by your caregiver.  Keep all follow-up appointments as directed by your caregiver. SEEK MEDICAL CARE IF:   You notice red streaks coming from the infected area.  Your red area gets larger or turns dark in color.  Your bone or joint underneath the infected area becomes painful after the skin has healed.  Your infection returns in the same area or another area.  You notice a swollen bump in the infected area.  You develop new symptoms. SEEK IMMEDIATE MEDICAL CARE IF:   You have a fever.  You feel very  sleepy.  You develop vomiting or diarrhea.  You have a general ill feeling (malaise) with muscle aches and pains. MAKE SURE YOU:   Understand these instructions.  Will watch your condition.  Will get help right away if you are not doing well or get worse. Document Released: 06/17/2005 Document Revised: 03/08/2012 Document Reviewed: 11/23/2011 [facility] Patient Information 2014 [place]Care, [place]LLC.   Emergency Department Resource Guide 1) Find a Doctor and Pay Out of Pocket Although you won't have to find out who is covered by your insurance plan, it is a good idea to ask around and get recommendations. You will then need to call the office and see if the doctor you have chosen will accept you as a new patient and what types of options they offer for patients who are self-pay. Some doctors offer discounts or will set up payment plans for their patients who do not have insurance, but you will need to ask so you aren't surprised when you get to your appointment.  2) Contact Your Local Health Department Not all health departments have doctors that can see patients for sick visits, but many do, so it is worth a call to see if yours does. If you don't know where your local health department is, you can check in your phone book. The CDC also has a tool to help you locate your state's health department, and many state websites also have listings of all of their local health departments.  3) Find a Walk-in Clinic If your illness is not likely to be very severe or complicated, you  may want to try a walk in clinic. These are popping up all over the country in pharmacies, drugstores, and shopping centers. They're usually staffed by nurse practitioners or physician assistants that have been trained to treat common illnesses and complaints. They're usually fairly quick and inexpensive. However, if you have serious medical issues or chronic medical problems, these are probably not your best option.  No Primary  Care Doctor: - Call Health Connect at  [phone] - they can help you locate a primary care doctor that  accepts your insurance, provides certain services, etc. - Physician Referral Service- [phone]  Chronic Pain Problems: Organization         Address  Phone   Notes  [facility]  [phone] Patients need to be referred by their primary care doctor.   Medication Assistance: Organization         Address  Phone   Notes  [facility] Medication [facility] [address] [place] [place], [place] [address] [phone] --Must be a resident of [facility] -- Must have NO insurance coverage whatsoever (no Medicaid/ Medicare, etc.) -- The pt. MUST have a primary care doctor that directs their care regularly and follows them in the community   [facility]  [phone]   [employer]  [phone]    Agencies that provide inexpensive medical care: Organization         Address  Phone   Notes  [facility]  [phone]   [facility]    [phone]   [facility] [address] [place], [place] [address] [phone]   [facility] [address], [place] [phone]   [facility]anned [facility]    [phone]   [facility]    [phone]   [facility]  [address], [place] Phone:  [phone], Fax:  [phone] Hours of Operation:  9 am - 6 pm, M-F.  Also accepts Medicaid/Medicare and self-pay.  [facility]  [address], [place] Phone: [phone], Fax: [phone]. Hours of Operation:  8:30 am - 5:30 pm, M-F.  Also accepts Medicaid and self-pay.  [facility] [place] [address], [place] Phone: [phone]   [facility] [address] Natasha Bence [place], [place] [phone], Ext. [phone] Mondays & Thursdays: 7-9 AM.  First 15 patients are seen on a first  come, first serve basis.    Medicaid-accepting [facility] Providers:  Organization         Address  Phone   Notes  [facility] [address], [place] [phone] Also accepts self-pay patients.  [facility] [address] Laurell Josephs [place], [place]  [phone]   [facility] [address], [place] [phone]   [facility] [address] [place] [phone]   Renaye Rakers [address], [place]   [phone] Only accepts [place] Access [place] patients after they have their name applied to their card.   Self-Pay (no insurance) in [facility]:  Organization         Address  Phone   Notes  Sickle Cell Patients, [facility] [address] [place], [place] [phone]   [facility] [address] [place], [place] (  [phone]   [facility] [place]  [address], [place] [phone]   [facility]/Dr. Osei-Bonsu  [address] [place] or [address], [place] [phone] Phone number for both [place] and [place] locations is the same.  [facility] [address] [place] [phone]   [facility] [address] [place] or [address] [phone]   [facility] [address], [place] [phone], phone; [phone], fax Sees patients 1st and 3rd Saturday of every month.  Must not qualify for public or private insurance (i.e. Medicaid, Medicare, [place] Health Choice, Veterans' Benefits)  Household income should be no more than 200% of the poverty level The clinic cannot treat you if you are pregnant or think you are pregnant  Sexually transmitted diseases are not treated at the clinic.    Dental Care: Organization          Address  Phone  Notes  [facility] Department of [facility] [place] [address] [place], [place] [phone] Accepts children up to age 30 who are enrolled in [place] or [place] Health Choice; pregnant women with a Medicaid card; and children who have applied for Medicaid or [place] Health Choice, but were declined, whose parents can pay a reduced fee at time of service.  [facility] Department of [facility]  [address], [place] [phone] Accepts children up to age 18 who are enrolled in [place] or [place] Health Choice; pregnant women with a Medicaid card; and children who have applied for Medicaid or [place] Health Choice, but were declined, whose parents can pay a reduced fee at time of service.  [facility]  [address] [place], [place] [phone] Patients are seen by appointment only. Walk-ins are not accepted. [facility] will see patients 49 years of age and older. Monday - Tuesday (8am-5pm) Most Wednesdays (8:30-5pm) $30 per visit, cash only  [facility] Adult Dental Access PROGRAM  [address], [facility] [phone] Patients are seen by appointment only. Walk-ins are not accepted. [facility] will see patients 22 years of age and older. One Wednesday Evening (Monthly: Volunteer Based).  $30 per visit, cash only  [employer]  [phone] for adults; Children under age 44, call Graduate Pediatric Dentistry at [phone]. Children aged 60-14, please call [phone] to request a pediatric application.  Dental services are provided in all areas of dental care including fillings, crowns and bridges, complete and partial dentures, implants, gum treatment, root canals, and extractions. Preventive care is also provided. Treatment is provided to both adults and children. Patients are selected via a lottery and there is often a waiting list.   [facility] [address] [place]  [phone] [email or web address]   [facility] [address] [place], [place] [phone], Ext. [phone] Second and Fourth Thursday of each month, opens at 6:30 AM; Clinic ends at 9 AM.  Patients are seen on a first-come first-served basis, and a limited number are seen during each clinic.   [facility]  [address] Ether Griffins [place], [place] [phone]   Eligibility Requirements You must have lived in [place], [place], or [place] counties for at least the last three months.   You cannot be eligible for  state or Teacher, music, including [employer], [place], or [employer].   You generally cannot be eligible for healthcare insurance through your employer.    How to apply: Eligibility screenings are held every Tuesday and Wednesday afternoon from 1:00 pm until 4:00 pm. You do not need an appointment for the interview!  [facility] [address], [place], [place] [phone]   [facility]  [phone]   [facility] Health Department  [phone]   [facility] Health Department  [phone]    Behavioral Health Resources in the Community: Intensive Outpatient Programs Organization         Address  Phone  Notes  [facility] Services [address], [place], [place] [phone]   [facility] Outpatient [address], [place], [place] [phone]   [facility] [address], [place], [place]  [phone]   [facility] [address],  [place], [place] [phone] or [phone]   Substance Abuse Resources Organization         Address  Phone  Notes  [facility]  [phone]   [facility]  [phone]   [facility]  [phone]   Floydene Flock  [phone]   Residential & Outpatient Substance Abuse Program  [phone]   Psychological  Services Organization         Address  Phone  Notes  [facility]  [phone]   [facility] Services  [phone]   [facility] [address] [place] [phone] or [phone]    Mobile Crisis Teams Organization         Address  Phone  Notes  [facility], Mobile Crisis Care Unit  [phone]   [facility]  [address] [place], [place] [phone]   Doristine Locks [address] [place] [phone]    Self-Help/Support Groups Organization         Address  Phone             Notes  [facility] [place] - variety of support groups  336- I7437963 Call for more information  Narcotics Anonymous (NA), [facility] [address], [employer] [place]  2 meetings at this location   Statistician         Address  Phone  Notes  [facility] [address],    [place]  [phone]   [facility]  [address], [place] [address], [place], [place] [phone]   [facility] [address] [place], [place] [phone] Admissions: 8am-3pm M-F  [facility] [address]    [place], [place] [phone]   [facility] [address] Starling Manns [place], [place] [phone]   [facility] [address]  [place], [place] [phone]   [facility] - Intensive Outpatient [address] Laurell Joseph[address], [place], [place] [phone]   [facility] ([facility] [address] [place]  [place], [place] [phone] or [phone]   [facility] ([facility]) [address], [place], [place] [phone] Accepts Medicaid  [employer] [address]  [place] [phone] Substance Abuse/Addiction Treatment   [facility] Organization         Address  Phone  Notes  [facility]  [phone]   Angie Fava, PhD [address] [place], [place]   (  [phone] or (336) [phone]   °[facility]   [address] °[place], [place] [phone]   °[facility] [address], [place], [place] [phone] Insurance/Medicaid/sponsorship through [facility]  °[facility] [address]                                    [place], [place] [phone] Therapy/tele-psych/case  °[facility] [address]  ° [place], [place] [phone]    °Dr. Arfeen  [phone]   °[facility] [place]  [employer] [place] [facility] 1) [address],  °2) [address], [place] °3)  [address], [place] [phone] °([phone]) [phone] ° °([phone]) [phone]   °[place] Child Abuse Hotline (336) 342-1394 or (336) [phone] (After Hours)    ° ° ° °

## 2013-12-12 NOTE — ED Provider Notes (Signed)
Medical screening examination/treatment/procedure(s) were performed by non-physician practitioner and as supervising physician I was immediately available for consultation/collaboration.   EKG Interpretation None       Charita Lindenberger, MD 12/12/13 0019 

## 2013-12-13 ENCOUNTER — Encounter (HOSPITAL_COMMUNITY): Payer: Self-pay | Admitting: Emergency Medicine

## 2013-12-13 ENCOUNTER — Emergency Department (HOSPITAL_COMMUNITY)
Admission: EM | Admit: 2013-12-13 | Discharge: 2013-12-14 | Disposition: A | Discharge status: Home / Self Care | Payer: 59 | Attending: Emergency Medicine | Admitting: Emergency Medicine

## 2013-12-13 DIAGNOSIS — F17200 Nicotine dependence, unspecified, uncomplicated: Secondary | ICD-10-CM | POA: Insufficient documentation

## 2013-12-13 DIAGNOSIS — L03011 Cellulitis of right finger: Secondary | ICD-10-CM

## 2013-12-13 DIAGNOSIS — L02519 Cutaneous abscess of unspecified hand: Secondary | ICD-10-CM | POA: Insufficient documentation

## 2013-12-13 DIAGNOSIS — Z8709 Personal history of other diseases of the respiratory system: Secondary | ICD-10-CM | POA: Insufficient documentation

## 2013-12-13 DIAGNOSIS — L03019 Cellulitis of unspecified finger: Principal | ICD-10-CM | POA: Insufficient documentation

## 2013-12-13 DIAGNOSIS — Z792 Long term (current) use of antibiotics: Secondary | ICD-10-CM | POA: Insufficient documentation

## 2013-12-13 MED ORDER — HYDROCODONE-ACETAMINOPHEN 5-325 MG PO TABS
2.0000 | ORAL_TABLET | Freq: Once | ORAL | Status: AC
  Administered 2013-12-13: 2 via ORAL
  Filled 2013-12-13: qty 2

## 2013-12-13 MED ORDER — HYDROCODONE-ACETAMINOPHEN 5-325 MG PO TABS: 1.0000 | ORAL_TABLET | ORAL | Status: DC | PRN

## 2013-12-13 NOTE — ED Notes (Signed)
Pt has a ride home.  

## 2013-12-13 NOTE — ED Notes (Signed)
Pt c/o R 4th finger pain and swelling, first noticed "bump" and swelling to finger Friday. Swelling and small amount of drainage noted.

## 2013-12-13 NOTE — ED Provider Notes (Signed)
CSN: [id number]     Arrival date & time 12/13/13  2049 History  This chart was scribed for non-physician practitioner, Ivonne Andrew, PA-C working with Gwyneth Sprout, MD by Luisa Dago, ED scribe. This patient was seen in room WTR5/WTR5 and the patient's care was started at 11:35 PM.    Chief Complaint  Patient presents with  . Hand Pain   The history is provided by the patient. No language interpreter was used.   HPI Comments: Derek Sullivan is a 23 y.o. male who presents to the Emergency Department complaining of right ring finger pain that started about 4 days ago. Pt was seen yesterday at the ED complaining of similar symptoms. He told his provider yesterday that his finger pain started with a "bug bite" to the right 4th finger. Pt was complaining of associated itching and constant throbbing pain. He was prescribed Doxycycline and given 2 Tylenol pills upon discharge. Today, pt returns to the ED complaining of right 4th finger pain. He states that his finger is still swollen and painful. Pt states that his last ED visit made the pain worse. He states that the provider drained his finger; he reports that the movement with his hand has improved. Pt is still concerned about his finger. He states that he was told by the ED provider, upon his last visit, to return to the ED if the swelling to his finger gets worse.  [name] states that he has an appointment at 2:30 PM with the hand surgeon, but he was too concerned about his finger to wait. He denies any abdominal pain, nausea, emesis, fever, chills, or cough.  Past Medical History  Diagnosis Date  . Bronchitis    History reviewed. No pertinent past surgical history. No family history on file. History  Substance Use Topics  . Smoking status: Current Every Day Smoker -- 0.50 packs/day    Types: Cigarettes  . Smokeless tobacco: Not on file  . Alcohol Use: Yes     Comment: occasional     Review of Systems  Constitutional: Negative  for fever, chills and diaphoresis.  Gastrointestinal: Negative for nausea, vomiting and abdominal pain.  Musculoskeletal: Positive for arthralgias (4th finger of his right hand) and joint swelling (4th finger on his right hand).  All other systems reviewed and are negative.   Allergies  Review of patient's allergies indicates no known allergies.  Home Medications   Current Outpatient Rx  Name  Route  Sig  Dispense  Refill  . acetaminophen (TYLENOL) 500 MG tablet   Oral   Take 500 mg by mouth every 6 (six) hours as needed for moderate pain.         Marland Kitchen doxycycline (VIBRAMYCIN) 100 MG capsule   Oral   Take 1 capsule (100 mg total) by mouth 2 (two) times daily.   20 capsule   0    BP 124/78  Pulse 98  Temp(Src) 98.9 F (37.2 C) (Oral)  Resp 16  Ht 6\' 4"  (1.93 m)  Wt 320 lb (145.151 kg)  BMI 38.97 kg/m2  SpO2 100%  Physical Exam  Nursing note and vitals reviewed. Constitutional: He appears well-developed and well-nourished. No distress.  HENT:  Head: Normocephalic and atraumatic.  Eyes: Conjunctivae are normal. Right eye exhibits no discharge. Left eye exhibits no discharge.  Neck: Neck supple.  Cardiovascular: Normal rate, regular rhythm and normal heart sounds.  Exam reveals no gallop and no friction rub.   No murmur heard. Pulmonary/Chest: Effort normal and  breath sounds normal. No respiratory distress.  Abdominal: Soft. He exhibits no distension. There is no tenderness.  Musculoskeletal: He exhibits no edema and no tenderness.  Significant swelling and tenderness to the dorsal proximal right fourth finger. Evidence of a small puncture wound consistent with I&D without bleeding or drainage. There is no diffuse swelling of the finger. No pain with passive range of motion or during active extension. No sinus concern for tenosynovitis. No erythematous streaks up the hand or arm. Normal distal sensations and capillary refill.  Neurological: He is alert.  Skin: Skin is warm  and dry.  Psychiatric: He has a normal mood and affect. His behavior is normal. Thought content normal.    ED Course  Procedures   DIAGNOSTIC STUDIES: Oxygen Saturation is 100% on RA, normal by my interpretation.    COORDINATION OF CARE: 11:42 PM- Advised pt to follow up with the hand surgeon. Will prescribe pain medication for pain control. Advised pt to apply warm compresses. Pt advised of plan for treatment and pt agrees.  No signs for tenosynovitis at this time. Patient has good followup planned with the orthopedic hand appointment tomorrow. At this time he is in need of pain control. We'll give prescription for hydrocodone. Patient will continue doxycycline.   MDM   Final diagnoses:  Cellulitis of finger of right hand    I personally performed the services described in this documentation, which was scribed in my presence. The recorded information has been reviewed and is accurate.   Angus SellerPeter S Kavion Mancinas, PA-C 12/14/13 [phone]

## 2013-12-13 NOTE — Discharge Instructions (Signed)
Continue the antibiotics as prescribed. Keep your handelevated. Followup with the hand specialist tomorrow for your appointment as planned.    Cellulitis Cellulitis is an infection of the skin and the tissue under the skin. The infected area is usually red and tender. This happens most often in the arms and lower legs. HOME CARE   Take your antibiotic medicine as told. Finish the medicine even if you start to feel better.  Keep the infected arm or leg raised (elevated).  Put a warm cloth on the area up to 4 times per day.  Only take medicines as told by your doctor.  Keep all doctor visits as told. GET HELP RIGHT AWAY IF:   You have a fever.  You feel very sleepy.  You throw up (vomit) or have watery poop (diarrhea).  You feel sick and have muscle aches and pains.  You see red streaks on the skin coming from the infected area.  Your red area gets bigger or turns a dark color.  Your bone or joint under the infected area is painful after the skin heals.  Your infection comes back in the same area or different area.  You have a puffy (swollen) bump in the infected area.  You have new symptoms. MAKE SURE YOU:   Understand these instructions.  Will watch your condition.  Will get help right away if you are not doing well or get worse. Document Released: 02/24/2008 Document Revised: 03/08/2012 Document Reviewed: 11/23/2011 [facility] Patient Information 2014 [place], [place].   Abscess An abscess (boil or furuncle) is an infected area on or under the skin. This area is filled with yellowish-white fluid (pus) and other material (debris). HOME CARE   Only take medicines as told by your doctor.  If you were given antibiotic medicine, take it as directed. Finish the medicine even if you start to feel better.  If gauze is used, follow your doctor's directions for changing the gauze.  To avoid spreading the infection:  Keep your abscess covered with a bandage.  Wash  your hands well.  Do not share personal care items, towels, or whirlpools with others.  Avoid skin contact with others.  Keep your skin and clothes clean around the abscess.  Keep all doctor visits as told. GET HELP RIGHT AWAY IF:   You have more pain, puffiness (swelling), or redness in the wound site.  You have more fluid or blood coming from the wound site.  You have muscle aches, chills, or you feel sick.  You have a fever. MAKE SURE YOU:   Understand these instructions.  Will watch your condition.  Will get help right away if you are not doing well or get worse. Document Released: 02/24/2008 Document Revised: 03/08/2012 Document Reviewed: 11/20/2011 [facility] Patient Information 2014 [place]Care, [place]LLC.

## 2013-12-13 NOTE — ED Notes (Signed)
Pt has been taking Doxy x 2 days

## 2013-12-14 NOTE — ED Provider Notes (Signed)
Medical screening examination/treatment/procedure(s) were performed by non-physician practitioner and as supervising physician I was immediately available for consultation/collaboration.   EKG Interpretation None        Louetta Hollingshead, MD 12/14/13 2034 

## 2014-03-16 ENCOUNTER — Emergency Department (HOSPITAL_COMMUNITY)
Admission: EM | Admit: 2014-03-16 | Discharge: 2014-03-16 | Discharge status: Against Medical Advice | Payer: 59 | Attending: Emergency Medicine | Admitting: Emergency Medicine

## 2014-03-16 ENCOUNTER — Encounter (HOSPITAL_COMMUNITY): Payer: Self-pay | Admitting: Emergency Medicine

## 2014-03-16 DIAGNOSIS — R45851 Suicidal ideations: Secondary | ICD-10-CM | POA: Insufficient documentation

## 2014-03-16 DIAGNOSIS — F17200 Nicotine dependence, unspecified, uncomplicated: Secondary | ICD-10-CM | POA: Insufficient documentation

## 2014-03-16 NOTE — ED Notes (Signed)
Pt never entered room 39 from triage.  Pt left AMA in triage.

## 2014-03-16 NOTE — ED Notes (Signed)
CALLED FOR A SITTER. CALLED SECURITY, PT REFUSES BLOOD DRAW OR CHANGING INTO SCRUBS.

## 2014-03-16 NOTE — ED Notes (Signed)
Pt reports having thoughts of suicide, has thought about walking in front of traffic on the highway multiple times. Does not want to answer questions at triage.

## 2014-09-21 HISTORY — PX: APPENDECTOMY: SHX54

## 2014-10-26 ENCOUNTER — Emergency Department (HOSPITAL_COMMUNITY)
Admission: EM | Admit: 2014-10-26 | Discharge: 2014-10-27 | Disposition: A | Discharge status: Home / Self Care | Payer: No Typology Code available for payment source | Attending: Emergency Medicine | Admitting: Emergency Medicine

## 2014-10-26 ENCOUNTER — Encounter (HOSPITAL_COMMUNITY): Payer: Self-pay | Admitting: *Deleted

## 2014-10-26 ENCOUNTER — Emergency Department (HOSPITAL_COMMUNITY): Payer: No Typology Code available for payment source

## 2014-10-26 DIAGNOSIS — S3992XA Unspecified injury of lower back, initial encounter: Secondary | ICD-10-CM | POA: Insufficient documentation

## 2014-10-26 DIAGNOSIS — Y998 Other external cause status: Secondary | ICD-10-CM | POA: Diagnosis not present

## 2014-10-26 DIAGNOSIS — Z8709 Personal history of other diseases of the respiratory system: Secondary | ICD-10-CM | POA: Diagnosis not present

## 2014-10-26 DIAGNOSIS — S199XXA Unspecified injury of neck, initial encounter: Secondary | ICD-10-CM | POA: Insufficient documentation

## 2014-10-26 DIAGNOSIS — Z720 Tobacco use: Secondary | ICD-10-CM | POA: Insufficient documentation

## 2014-10-26 DIAGNOSIS — S79911A Unspecified injury of right hip, initial encounter: Secondary | ICD-10-CM | POA: Diagnosis not present

## 2014-10-26 DIAGNOSIS — Y92410 Unspecified street and highway as the place of occurrence of the external cause: Secondary | ICD-10-CM | POA: Insufficient documentation

## 2014-10-26 DIAGNOSIS — Z792 Long term (current) use of antibiotics: Secondary | ICD-10-CM | POA: Insufficient documentation

## 2014-10-26 DIAGNOSIS — S0990XA Unspecified injury of head, initial encounter: Secondary | ICD-10-CM | POA: Diagnosis not present

## 2014-10-26 DIAGNOSIS — S29001A Unspecified injury of muscle and tendon of front wall of thorax, initial encounter: Secondary | ICD-10-CM | POA: Diagnosis present

## 2014-10-26 DIAGNOSIS — Z7982 Long term (current) use of aspirin: Secondary | ICD-10-CM | POA: Insufficient documentation

## 2014-10-26 DIAGNOSIS — Y9389 Activity, other specified: Secondary | ICD-10-CM | POA: Diagnosis not present
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
## 2014-10-27 MED ORDER — NAPROXEN 500 MG PO TABS: 500.0000 mg | ORAL_TABLET | Freq: Two times a day (BID) | ORAL | Status: DC

## 2014-10-27 MED ORDER — CYCLOBENZAPRINE HCL 10 MG PO TABS: 10.0000 mg | ORAL_TABLET | Freq: Two times a day (BID) | ORAL | Status: DC | PRN

## 2014-10-27 MED ORDER — OXYCODONE-ACETAMINOPHEN 5-325 MG PO TABS
1.0000 | ORAL_TABLET | Freq: Once | ORAL | Status: AC
  Administered 2014-10-27: 1 via ORAL
  Filled 2014-10-27: qty 1

## 2014-10-27 NOTE — Discharge Instructions (Signed)

## 2015-02-26 ENCOUNTER — Encounter (HOSPITAL_COMMUNITY): Payer: Self-pay

## 2015-02-26 ENCOUNTER — Emergency Department (HOSPITAL_COMMUNITY): Payer: Self-pay

## 2015-02-26 ENCOUNTER — Emergency Department (HOSPITAL_COMMUNITY)
Admission: EM | Admit: 2015-02-26 | Discharge: 2015-02-26 | Discharge status: Against Medical Advice | Payer: Self-pay | Attending: Emergency Medicine | Admitting: Emergency Medicine

## 2015-02-26 DIAGNOSIS — R079 Chest pain, unspecified: Secondary | ICD-10-CM | POA: Insufficient documentation

## 2015-02-26 DIAGNOSIS — Z720 Tobacco use: Secondary | ICD-10-CM | POA: Insufficient documentation

## 2015-02-26 LAB — CBC
HCT: 45.1 % (ref 39.0–52.0)
HEMOGLOBIN: 15.6 g/dL (ref 13.0–17.0)
MCH: 32.1 pg (ref 26.0–34.0)
MCHC: 34.6 g/dL (ref 30.0–36.0)
MCV: 92.8 fL (ref 78.0–100.0)
Platelets: 316 10*3/uL (ref 150–400)
RBC: 4.86 MIL/uL (ref 4.22–5.81)
RDW: 12.7 % (ref 11.5–15.5)
WBC: 6.1 10*3/uL (ref 4.0–10.5)

## 2015-02-26 LAB — BASIC METABOLIC PANEL
Anion gap: 11 (ref 5–15)
BUN: 5 mg/dL — ABNORMAL LOW (ref 6–20)
CHLORIDE: 103 mmol/L (ref 101–111)
CO2: 23 mmol/L (ref 22–32)
Calcium: 9.6 mg/dL (ref 8.9–10.3)
Creatinine, Ser: 0.94 mg/dL (ref 0.61–1.24)
GFR calc Af Amer: >60 mL/min (ref >60)
GFR calc non Af Amer: >60 mL/min (ref >60)
GLUCOSE: 102 mg/dL — AB (ref 65–99)
POTASSIUM: 4.1 mmol/L (ref 3.5–5.1)
Sodium: 137 mmol/L (ref 135–145)

## 2015-02-26 LAB — I-STAT TROPONIN, ED: TROPONIN I, POC: 0 ng/mL (ref 0.00–0.08)

## 2015-02-26 NOTE — ED Notes (Signed)
Pt has had intermittent chest pain since Friday. Worse on Monday. Hurts when he coughs and hurts all over his chest.

## 2015-02-26 NOTE — ED Notes (Signed)
Pt stated he was not going to stay and handed me his blood pressure cuff. Pt advised if he felt worse to please come back.

## 2015-04-24 ENCOUNTER — Emergency Department (HOSPITAL_COMMUNITY): Payer: Self-pay

## 2015-04-24 ENCOUNTER — Encounter (HOSPITAL_COMMUNITY): Payer: Self-pay | Admitting: Emergency Medicine

## 2015-04-24 ENCOUNTER — Observation Stay (HOSPITAL_COMMUNITY)
Admission: EM | Admit: 2015-04-24 | Discharge: 2015-04-25 | Disposition: A | Discharge status: Home / Self Care | Payer: Self-pay | Attending: General Surgery | Admitting: General Surgery

## 2015-04-24 ENCOUNTER — Observation Stay (HOSPITAL_COMMUNITY): Payer: Self-pay | Admitting: Anesthesiology

## 2015-04-24 ENCOUNTER — Encounter (HOSPITAL_COMMUNITY)
Admission: EM | Disposition: A | Discharge status: Home / Self Care | Payer: Self-pay | Source: Home / Self Care | Attending: Emergency Medicine

## 2015-04-24 DIAGNOSIS — K353 Acute appendicitis with localized peritonitis: Principal | ICD-10-CM | POA: Insufficient documentation

## 2015-04-24 DIAGNOSIS — F17210 Nicotine dependence, cigarettes, uncomplicated: Secondary | ICD-10-CM | POA: Insufficient documentation

## 2015-04-24 DIAGNOSIS — K3580 Unspecified acute appendicitis: Secondary | ICD-10-CM | POA: Diagnosis present

## 2015-04-24 DIAGNOSIS — R1031 Right lower quadrant pain: Secondary | ICD-10-CM | POA: Insufficient documentation

## 2015-04-24 HISTORY — DX: Headache, unspecified: R51.9

## 2015-04-24 HISTORY — DX: Major depressive disorder, single episode, unspecified: F32.9

## 2015-04-24 HISTORY — DX: Unspecified chronic bronchitis: J42

## 2015-04-24 HISTORY — PX: APPENDECTOMY: SHX54

## 2015-04-24 HISTORY — DX: Migraine, unspecified, not intractable, without status migrainosus: G43.909

## 2015-04-24 HISTORY — PX: LAPAROSCOPIC APPENDECTOMY: SHX408

## 2015-04-24 HISTORY — DX: Depression, unspecified: F32.A

## 2015-04-24 HISTORY — DX: Headache: R51

## 2015-04-24 LAB — DIFFERENTIAL
BASOS ABS: 0 10*3/uL (ref 0.0–0.1)
BASOS PCT: 0 % (ref 0–1)
Eosinophils Absolute: 0.1 10*3/uL (ref 0.0–0.7)
Eosinophils Relative: 1 % (ref 0–5)
LYMPHS ABS: 3.8 10*3/uL (ref 0.7–4.0)
Lymphocytes Relative: 33 % (ref 12–46)
MONO ABS: 0.9 10*3/uL (ref 0.1–1.0)
Monocytes Relative: 8 % (ref 3–12)
NEUTROS ABS: 6.7 10*3/uL (ref 1.7–7.7)
NEUTROS PCT: 58 % (ref 43–77)

## 2015-04-24 LAB — COMPREHENSIVE METABOLIC PANEL
ALBUMIN: 4.1 g/dL (ref 3.5–5.0)
ALT: 17 U/L (ref 17–63)
ANION GAP: 9 (ref 5–15)
AST: 24 U/L (ref 15–41)
Alkaline Phosphatase: 74 U/L (ref 38–126)
BUN: 7 mg/dL (ref 6–20)
CALCIUM: 9.4 mg/dL (ref 8.9–10.3)
CO2: 25 mmol/L (ref 22–32)
Chloride: 104 mmol/L (ref 101–111)
Creatinine, Ser: 1.07 mg/dL (ref 0.61–1.24)
GFR calc Af Amer: >60 mL/min (ref >60)
GFR calc non Af Amer: >60 mL/min (ref >60)
GLUCOSE: 102 mg/dL — AB (ref 65–99)
POTASSIUM: 3.9 mmol/L (ref 3.5–5.1)
Sodium: 138 mmol/L (ref 135–145)
Total Bilirubin: 0.6 mg/dL (ref 0.3–1.2)
Total Protein: 6.5 g/dL (ref 6.5–8.1)

## 2015-04-24 LAB — URINALYSIS, ROUTINE W REFLEX MICROSCOPIC
BILIRUBIN URINE: NEGATIVE
Glucose, UA: NEGATIVE mg/dL
Hgb urine dipstick: NEGATIVE
Ketones, ur: 15 mg/dL — AB
Leukocytes, UA: NEGATIVE
Nitrite: NEGATIVE
Protein, ur: NEGATIVE mg/dL
Specific Gravity, Urine: 1.029 (ref 1.005–1.030)
Urobilinogen, UA: 0.2 mg/dL (ref 0.0–1.0)
pH: 8.5 — ABNORMAL HIGH (ref 5.0–8.0)

## 2015-04-24 LAB — CBC
HEMATOCRIT: 40.9 % (ref 39.0–52.0)
Hemoglobin: 14.3 g/dL (ref 13.0–17.0)
MCH: 32.6 pg (ref 26.0–34.0)
MCHC: 35 g/dL (ref 30.0–36.0)
MCV: 93.2 fL (ref 78.0–100.0)
Platelets: 288 10*3/uL (ref 150–400)
RBC: 4.39 MIL/uL (ref 4.22–5.81)
RDW: 12.5 % (ref 11.5–15.5)
WBC: 12 10*3/uL — AB (ref 4.0–10.5)

## 2015-04-24 LAB — LIPASE, BLOOD: Lipase: 21 U/L — ABNORMAL LOW (ref 22–51)

## 2015-04-24 LAB — SURGICAL PCR SCREEN
MRSA, PCR: NEGATIVE
Staphylococcus aureus: NEGATIVE

## 2015-04-24 SURGERY — APPENDECTOMY, LAPAROSCOPIC
Anesthesia: General | Site: Abdomen

## 2015-04-24 MED ORDER — LIDOCAINE HCL (CARDIAC) 20 MG/ML IV SOLN
INTRAVENOUS | Status: AC
  Filled 2015-04-24: qty 10

## 2015-04-24 MED ORDER — 0.9 % SODIUM CHLORIDE (POUR BTL) OPTIME
TOPICAL | Status: DC | PRN
  Administered 2015-04-24: 1000 mL

## 2015-04-24 MED ORDER — ONDANSETRON HCL 4 MG/2ML IJ SOLN
INTRAMUSCULAR | Status: AC
  Filled 2015-04-24: qty 2

## 2015-04-24 MED ORDER — HYDROMORPHONE HCL 1 MG/ML IJ SOLN
INTRAMUSCULAR | Status: AC
  Filled 2015-04-24: qty 1

## 2015-04-24 MED ORDER — SODIUM CHLORIDE 0.9 % IV BOLUS (SEPSIS)
1000.0000 mL | Freq: Once | INTRAVENOUS | Status: AC
  Administered 2015-04-24: 1000 mL via INTRAVENOUS

## 2015-04-24 MED ORDER — IOHEXOL 300 MG/ML  SOLN
100.0000 mL | Freq: Once | INTRAMUSCULAR | Status: AC | PRN
  Administered 2015-04-24: 100 mL via INTRAVENOUS

## 2015-04-24 MED ORDER — MORPHINE SULFATE 4 MG/ML IJ SOLN
4.0000 mg | Freq: Once | INTRAMUSCULAR | Status: AC
  Administered 2015-04-24: 4 mg via INTRAVENOUS
  Filled 2015-04-24: qty 1

## 2015-04-24 MED ORDER — ROCURONIUM BROMIDE 100 MG/10ML IV SOLN
INTRAVENOUS | Status: DC | PRN
  Administered 2015-04-24: 30 mg via INTRAVENOUS

## 2015-04-24 MED ORDER — SUGAMMADEX SODIUM 200 MG/2ML IV SOLN
INTRAVENOUS | Status: AC
  Filled 2015-04-24: qty 2

## 2015-04-24 MED ORDER — ONDANSETRON HCL 4 MG/2ML IJ SOLN
INTRAMUSCULAR | Status: DC | PRN
  Administered 2015-04-24: 4 mg via INTRAVENOUS

## 2015-04-24 MED ORDER — HYDROCODONE-ACETAMINOPHEN 5-325 MG PO TABS
1.0000 | ORAL_TABLET | ORAL | Status: DC | PRN
  Administered 2015-04-24 – 2015-04-25 (×3): 2 via ORAL
  Filled 2015-04-24 (×3): qty 2

## 2015-04-24 MED ORDER — SODIUM CHLORIDE 0.9 % IV SOLN
INTRAVENOUS | Status: DC
  Administered 2015-04-24: 07:00:00 via INTRAVENOUS

## 2015-04-24 MED ORDER — SUGAMMADEX SODIUM 200 MG/2ML IV SOLN
INTRAVENOUS | Status: DC | PRN
  Administered 2015-04-24: 200 mg via INTRAVENOUS

## 2015-04-24 MED ORDER — LIDOCAINE HCL (CARDIAC) 20 MG/ML IV SOLN
INTRAVENOUS | Status: DC | PRN
  Administered 2015-04-24: 100 mg via INTRAVENOUS

## 2015-04-24 MED ORDER — FENTANYL CITRATE (PF) 250 MCG/5ML IJ SOLN
INTRAMUSCULAR | Status: AC
  Filled 2015-04-24: qty 5

## 2015-04-24 MED ORDER — PROPOFOL 10 MG/ML IV BOLUS
INTRAVENOUS | Status: DC | PRN
  Administered 2015-04-24: 130 mg via INTRAVENOUS

## 2015-04-24 MED ORDER — MIDAZOLAM HCL 2 MG/2ML IJ SOLN
INTRAMUSCULAR | Status: AC
  Filled 2015-04-24: qty 4

## 2015-04-24 MED ORDER — ONDANSETRON HCL 4 MG/2ML IJ SOLN: 4.0000 mg | Freq: Four times a day (QID) | INTRAMUSCULAR | Status: DC | PRN

## 2015-04-24 MED ORDER — MEPERIDINE HCL 25 MG/ML IJ SOLN: 6.2500 mg | INTRAMUSCULAR | Status: DC | PRN

## 2015-04-24 MED ORDER — BUPIVACAINE-EPINEPHRINE 0.25% -1:200000 IJ SOLN
INTRAMUSCULAR | Status: DC | PRN
  Administered 2015-04-24: 10 mL

## 2015-04-24 MED ORDER — ARTIFICIAL TEARS OP OINT
TOPICAL_OINTMENT | OPHTHALMIC | Status: DC | PRN
  Administered 2015-04-24: 1 via OPHTHALMIC

## 2015-04-24 MED ORDER — DEXAMETHASONE SODIUM PHOSPHATE 4 MG/ML IJ SOLN
INTRAMUSCULAR | Status: DC | PRN
  Administered 2015-04-24: 8 mg via INTRAVENOUS

## 2015-04-24 MED ORDER — DIPHENHYDRAMINE HCL 50 MG/ML IJ SOLN
25.0000 mg | Freq: Once | INTRAMUSCULAR | Status: AC
  Administered 2015-04-24: 25 mg via INTRAVENOUS

## 2015-04-24 MED ORDER — LACTATED RINGERS IV SOLN
INTRAVENOUS | Status: DC | PRN
  Administered 2015-04-24 (×2): via INTRAVENOUS

## 2015-04-24 MED ORDER — SODIUM CHLORIDE 0.9 % IV SOLN
INTRAVENOUS | Status: DC
  Administered 2015-04-24 – 2015-04-25 (×2): via INTRAVENOUS

## 2015-04-24 MED ORDER — BUPIVACAINE-EPINEPHRINE (PF) 0.25% -1:200000 IJ SOLN
INTRAMUSCULAR | Status: AC
  Filled 2015-04-24: qty 30

## 2015-04-24 MED ORDER — ENOXAPARIN SODIUM 40 MG/0.4ML ~~LOC~~ SOLN
40.0000 mg | SUBCUTANEOUS | Status: DC
  Filled 2015-04-24: qty 0.4

## 2015-04-24 MED ORDER — PROPOFOL 10 MG/ML IV BOLUS
INTRAVENOUS | Status: AC
  Filled 2015-04-24: qty 20

## 2015-04-24 MED ORDER — DEXTROSE 5 % IV SOLN
2.0000 g | Freq: Four times a day (QID) | INTRAVENOUS | Status: DC
  Administered 2015-04-24: 2 g via INTRAVENOUS
  Filled 2015-04-24 (×3): qty 2

## 2015-04-24 MED ORDER — ONDANSETRON HCL 4 MG/2ML IJ SOLN: 4.0000 mg | Freq: Once | INTRAMUSCULAR | Status: DC | PRN

## 2015-04-24 MED ORDER — ARTIFICIAL TEARS OP OINT
TOPICAL_OINTMENT | OPHTHALMIC | Status: AC
  Filled 2015-04-24: qty 3.5

## 2015-04-24 MED ORDER — DEXTROSE 5 % IV SOLN
2.0000 g | Freq: Four times a day (QID) | INTRAVENOUS | Status: AC
  Administered 2015-04-24 (×2): 2 g via INTRAVENOUS
  Filled 2015-04-24 (×2): qty 2

## 2015-04-24 MED ORDER — HYDROMORPHONE HCL 1 MG/ML IJ SOLN: 0.2500 mg | INTRAMUSCULAR | Status: DC | PRN

## 2015-04-24 MED ORDER — SODIUM CHLORIDE 0.9 % IR SOLN
Status: DC | PRN
  Administered 2015-04-24: 1000 mL

## 2015-04-24 MED ORDER — FENTANYL CITRATE (PF) 100 MCG/2ML IJ SOLN
INTRAMUSCULAR | Status: DC | PRN
  Administered 2015-04-24: 50 ug via INTRAVENOUS
  Administered 2015-04-24 (×2): 100 ug via INTRAVENOUS
  Administered 2015-04-24: 150 ug via INTRAVENOUS
  Administered 2015-04-24: 100 ug via INTRAVENOUS

## 2015-04-24 MED ORDER — DEXTROSE 5 % IV SOLN
1.0000 g | Freq: Once | INTRAVENOUS | Status: AC
  Administered 2015-04-24: 1 g via INTRAVENOUS
  Filled 2015-04-24: qty 1

## 2015-04-24 MED ORDER — HYDROMORPHONE HCL 1 MG/ML IJ SOLN
1.0000 mg | INTRAMUSCULAR | Status: DC | PRN
  Administered 2015-04-24 – 2015-04-25 (×4): 1 mg via INTRAVENOUS
  Filled 2015-04-24 (×4): qty 1

## 2015-04-24 MED ORDER — MIDAZOLAM HCL 5 MG/5ML IJ SOLN
INTRAMUSCULAR | Status: DC | PRN
  Administered 2015-04-24: 2 mg via INTRAVENOUS

## 2015-04-24 MED ORDER — ONDANSETRON HCL 4 MG/2ML IJ SOLN
4.0000 mg | Freq: Once | INTRAMUSCULAR | Status: AC
  Administered 2015-04-24: 4 mg via INTRAVENOUS
  Filled 2015-04-24: qty 2

## 2015-04-24 MED ORDER — DIPHENHYDRAMINE HCL 50 MG/ML IJ SOLN
INTRAMUSCULAR | Status: AC
  Filled 2015-04-24: qty 1

## 2015-04-24 MED ORDER — SUCCINYLCHOLINE CHLORIDE 20 MG/ML IJ SOLN
INTRAMUSCULAR | Status: DC | PRN
  Administered 2015-04-24: 140 mg via INTRAVENOUS

## 2015-04-24 MED ORDER — DEXTROSE 5 % IV SOLN
2.0000 g | INTRAVENOUS | Status: DC
  Filled 2015-04-24: qty 2

## 2015-04-24 SURGICAL SUPPLY — 50 items
APPLIER CLIP ROT 10 11.4 M/L (STAPLE)
APR CLP MED LRG 11.4X10 (STAPLE)
BAG SPEC RTRVL 10 TROC 200 (ENDOMECHANICALS) ×1
CANISTER SUCTION 2500CC (MISCELLANEOUS) ×3 IMPLANT
CHLORAPREP W/TINT 26ML (MISCELLANEOUS) ×3 IMPLANT
CLIP APPLIE ROT 10 11.4 M/L (STAPLE) IMPLANT
CLOSURE WOUND 1/2 X4 (GAUZE/BANDAGES/DRESSINGS) ×1
COVER SURGICAL LIGHT HANDLE (MISCELLANEOUS) ×3 IMPLANT
CUTTER FLEX LINEAR 45M (STAPLE) ×3 IMPLANT
DECANTER SPIKE VIAL GLASS SM (MISCELLANEOUS) ×2 IMPLANT
DEVICE TROCAR PUNCTURE CLOSURE (ENDOMECHANICALS) ×2 IMPLANT
ELECT REM PT RETURN 9FT ADLT (ELECTROSURGICAL) ×3
ELECTRODE REM PT RTRN 9FT ADLT (ELECTROSURGICAL) ×1 IMPLANT
GLOVE BIO SURGEON STRL SZ 6.5 (GLOVE) ×1 IMPLANT
GLOVE BIO SURGEON STRL SZ7 (GLOVE) ×3 IMPLANT
GLOVE BIO SURGEONS STRL SZ 6.5 (GLOVE) ×1
GLOVE BIOGEL PI IND STRL 6.5 (GLOVE) IMPLANT
GLOVE BIOGEL PI IND STRL 7.5 (GLOVE) ×1 IMPLANT
GLOVE BIOGEL PI INDICATOR 6.5 (GLOVE) ×4
GLOVE BIOGEL PI INDICATOR 7.5 (GLOVE) ×2
GLOVE SURG SS PI 6.5 STRL IVOR (GLOVE) ×2 IMPLANT
GOWN STRL REUS W/ TWL LRG LVL3 (GOWN DISPOSABLE) ×3 IMPLANT
GOWN STRL REUS W/TWL LRG LVL3 (GOWN DISPOSABLE) ×9
KIT BASIN OR (CUSTOM PROCEDURE TRAY) ×3 IMPLANT
KIT ROOM TURNOVER OR (KITS) ×3 IMPLANT
LIQUID BAND (GAUZE/BANDAGES/DRESSINGS) ×3 IMPLANT
NS IRRIG 1000ML POUR BTL (IV SOLUTION) ×3 IMPLANT
PAD ARMBOARD 7.5X6 YLW CONV (MISCELLANEOUS) ×6 IMPLANT
POUCH RETRIEVAL ECOSAC 10 (ENDOMECHANICALS) ×1 IMPLANT
POUCH RETRIEVAL ECOSAC 10MM (ENDOMECHANICALS) ×2
RELOAD 45 VASCULAR/THIN (ENDOMECHANICALS) ×3 IMPLANT
RELOAD STAPLE 45 2.5 WHT GRN (ENDOMECHANICALS) ×1 IMPLANT
RELOAD STAPLE 45 3.5 BLU ETS (ENDOMECHANICALS) IMPLANT
RELOAD STAPLE TA45 3.5 REG BLU (ENDOMECHANICALS) IMPLANT
SCALPEL HARMONIC ACE (MISCELLANEOUS) ×3 IMPLANT
SCISSORS LAP 5X35 DISP (ENDOMECHANICALS) IMPLANT
SET IRRIG TUBING LAPAROSCOPIC (IRRIGATION / IRRIGATOR) ×3 IMPLANT
SLEEVE ENDOPATH XCEL 5M (ENDOMECHANICALS) ×3 IMPLANT
SPECIMEN JAR SMALL (MISCELLANEOUS) ×3 IMPLANT
STRIP CLOSURE SKIN 1/2X4 (GAUZE/BANDAGES/DRESSINGS) ×1 IMPLANT
SUT MNCRL AB 4-0 PS2 18 (SUTURE) ×3 IMPLANT
SUT VIC AB 0 CT1 27 (SUTURE) ×3
SUT VIC AB 0 CT1 27XBRD ANBCTR (SUTURE) IMPLANT
TOWEL OR 17X24 6PK STRL BLUE (TOWEL DISPOSABLE) ×1 IMPLANT
TOWEL OR 17X26 10 PK STRL BLUE (TOWEL DISPOSABLE) ×3 IMPLANT
TRAY FOLEY CATH 16FR SILVER (SET/KITS/TRAYS/PACK) ×3 IMPLANT
TRAY LAPAROSCOPIC MC (CUSTOM PROCEDURE TRAY) ×3 IMPLANT
TROCAR XCEL BLUNT TIP 100MML (ENDOMECHANICALS) ×3 IMPLANT
TROCAR XCEL NON-BLD 5MMX100MML (ENDOMECHANICALS) ×3 IMPLANT
TUBING INSUFFLATION (TUBING) ×3 IMPLANT

## 2015-04-24 NOTE — Progress Notes (Signed)
Patient ID: Derek Sullivan, male   DOB: 1991/04/26, 24 y.o.   MRN: [id number] Reviewed ct and history, rlq tenderness on exam, elevated wbc, I discussed the procedure in detail.   We discussed the risks and benefits of a laparoscopic appendectomy and will proceed this am

## 2015-04-24 NOTE — Transfer of Care (Signed)
Immediate Anesthesia Transfer of Care Note  Patient: Derek Sullivan  Procedure(s) Performed: Procedure(s): APPENDECTOMY LAPAROSCOPIC (N/A)  Patient Location: PACU  Anesthesia Type:General  Level of Consciousness: awake, oriented, sedated, patient cooperative and responds to stimulation  Airway & Oxygen Therapy: Patient Spontanous Breathing and Patient connected to nasal cannula oxygen  Post-op Assessment: Report given to RN, Post -op Vital signs reviewed and stable, Patient moving all extremities and Patient moving all extremities X 4  Post vital signs: Reviewed and stable  Last Vitals:  Filed Vitals:   04/24/15 1026  BP:   Pulse:   Temp: 36.7 C  Resp:     Complications: No apparent anesthesia complications

## 2015-04-24 NOTE — Anesthesia Preprocedure Evaluation (Addendum)
Anesthesia Evaluation  Patient identified by MRN, date of birth, ID band Patient awake    Reviewed: Allergy & Precautions, NPO status , Patient's Chart, lab work & pertinent test results  Airway Mallampati: I  TM Distance: >3 FB Neck ROM: Full    Dental   Pulmonary Current Smoker,    Pulmonary exam normal        Cardiovascular Normal cardiovascular exam     Neuro/Psych    GI/Hepatic   Endo/Other    Renal/GU      Musculoskeletal   Abdominal   Peds  Hematology   Anesthesia Other Findings   Reproductive/Obstetrics                             Anesthesia Physical Anesthesia Plan  ASA: I and emergent  Anesthesia Plan: General   Post-op Pain Management:    Induction: Intravenous, Rapid sequence and Cricoid pressure planned  Airway Management Planned: Oral ETT  Additional Equipment:   Intra-op Plan:   Post-operative Plan: Extubation in OR  Informed Consent: I have reviewed the patients History and Physical, chart, labs and discussed the procedure including the risks, benefits and alternatives for the proposed anesthesia with the patient or authorized representative who has indicated his/her understanding and acceptance.     Plan Discussed with: CRNA and Surgeon  Anesthesia Plan Comments:         Anesthesia Quick Evaluation  

## 2015-04-24 NOTE — ED Notes (Signed)
Pt. reports RLQ pain with nausea , denies emesis or diarrhea .

## 2015-04-24 NOTE — Op Note (Signed)
Preoperative diagnosis: acute appendicitis Postoperative diagnosis: same as above Procedure: laparoscopic appendectomy Surgeon: Dr Harden Mo Anesthesia: general EBL: minimal Drains none Specimen appendix to pathology Complications: none Sponge count correct at completion Disposition to recovery stable  Indications: This is a 23 yom with rlq pain, elevated wbc and ct scan consistent with appendicitis.  We discussed laparoscopic appendectomy.   Procedure: After informed consent was obtained the patient was taken to the operating room. He was already given antibiotics. Sequential compression devices were on his legs. He was placed under general anesthesia without complication. His abdomen was prepped and draped in the standard sterile surgical fashion. A surgical timeout was then performed. A foley catheter was placed.   I infiltrated marcaine below the umbilicus. I made an incision and then entered the fascia sharply. I then entered the peritoneum bluntly. I placed a 0 vicryl pursestring suture and inserted a hasson trocar. I then inserted 2 further 5 mm trocars in the suprapubic region and the mid abdomen. I identified the appendix. I then dissected it free from the cecum. I divided the mesoappendix with the harmonic scalpel. I then divided the appendix with the gia stapler. I then placed this in a bag and removed it from the abdomen.  I then obtained hemostasis and irrigated. I then removed the umbilical trocar and closed with 0 vicryl and the endoclose device after tying down the pursestring.  I then desufflated the abdomen and removed all my remaining trocars. I then closedthese with 4-0 Monocryl and Dermabond. He tolerated this well was extubated and transferred to the recovery room in stable condition

## 2015-04-24 NOTE — H&P (Signed)
Derek Sullivan is an 24 y.o. male.   Chief Complaint:  Acute appendicitis HPI: 24 yo male presents with about 12 hours of acute RLQ/ suprapubic abdominal pain.  This is associated with some nausea and vomiting.  No diarrhea.  No urinary symptoms.  The patient presented to the ED for evaluation and was noted to have appendicitis on CT scan.  He is accompanied by his mother.  Past Medical History  Diagnosis Date  . Bronchitis     History reviewed. No pertinent past surgical history.  No family history on file. Social History:  reports that he has been smoking Cigarettes.  He has been smoking about 0.00 packs per day. He does not have any smokeless tobacco history on file. He reports that he drinks alcohol. He reports that he uses illicit drugs (Marijuana).  Allergies: No Known Allergies  Prior to Admission medications   Medication Sig Start Date End Date Taking? Authorizing Provider  cyclobenzaprine (FLEXERIL) 10 MG tablet Take 1 tablet (10 mg total) by mouth 2 (two) times daily as needed for muscle spasms. Patient not taking: Reported on 04/24/2015 10/27/14   Etta Quill, NP  doxycycline (VIBRAMYCIN) 100 MG capsule Take 1 capsule (100 mg total) by mouth 2 (two) times daily. Patient not taking: Reported on 04/24/2015 12/11/13   Lucila Maine, PA-C  HYDROcodone-acetaminophen (NORCO/VICODIN) 5-325 MG per tablet Take 1-2 tablets by mouth every 4 (four) hours as needed for moderate pain. Patient not taking: Reported on 04/24/2015 12/13/13   Hazel Sams, PA-C  naproxen (NAPROSYN) 500 MG tablet Take 1 tablet (500 mg total) by mouth 2 (two) times daily. Patient not taking: Reported on 04/24/2015 10/27/14   Etta Quill, NP     Results for orders placed or performed during the hospital encounter of 04/24/15 (from the past 48 hour(s))  Lipase, blood     Status: Abnormal   Collection Time: 04/24/15 12:56 AM  Result Value Ref Range   Lipase 21 (L) 22 - 51 U/L  Comprehensive metabolic panel     Status:  Abnormal   Collection Time: 04/24/15 12:56 AM  Result Value Ref Range   Sodium 138 135 - 145 mmol/L   Potassium 3.9 3.5 - 5.1 mmol/L   Chloride 104 101 - 111 mmol/L   CO2 25 22 - 32 mmol/L   Glucose, Bld 102 (H) 65 - 99 mg/dL   BUN 7 6 - 20 mg/dL   Creatinine, Ser 1.07 0.61 - 1.24 mg/dL   Calcium 9.4 8.9 - 10.3 mg/dL   Total Protein 6.5 6.5 - 8.1 g/dL   Albumin 4.1 3.5 - 5.0 g/dL   AST 24 15 - 41 U/L   ALT 17 17 - 63 U/L   Alkaline Phosphatase 74 38 - 126 U/L   Total Bilirubin 0.6 0.3 - 1.2 mg/dL   GFR calc non Af Amer >60 >60 mL/min   GFR calc Af Amer >60 >60 mL/min    Comment: (NOTE) The eGFR has been calculated using the CKD EPI equation. This calculation has not been validated in all clinical situations. eGFR's persistently <60 mL/min signify possible Chronic Kidney Disease.    Anion gap 9 5 - 15  CBC     Status: Abnormal   Collection Time: 04/24/15 12:56 AM  Result Value Ref Range   WBC 12.0 (H) 4.0 - 10.5 K/uL   RBC 4.39 4.22 - 5.81 MIL/uL   Hemoglobin 14.3 13.0 - 17.0 g/dL   HCT 40.9 39.0 - 52.0 %  MCV 93.2 78.0 - 100.0 fL   MCH 32.6 26.0 - 34.0 pg   MCHC 35.0 30.0 - 36.0 g/dL   RDW 12.5 11.5 - 15.5 %   Platelets 288 150 - 400 K/uL  Differential     Status: None   Collection Time: 04/24/15 12:56 AM  Result Value Ref Range   Neutrophils Relative % 58 43 - 77 %   Neutro Abs 6.7 1.7 - 7.7 K/uL   Lymphocytes Relative 33 12 - 46 %   Lymphs Abs 3.8 0.7 - 4.0 K/uL   Monocytes Relative 8 3 - 12 %   Monocytes Absolute 0.9 0.1 - 1.0 K/uL   Eosinophils Relative 1 0 - 5 %   Eosinophils Absolute 0.1 0.0 - 0.7 K/uL   Basophils Relative 0 0 - 1 %   Basophils Absolute 0.0 0.0 - 0.1 K/uL  Urinalysis, Routine w reflex microscopic (not at [facility])     Status: Abnormal   Collection Time: 04/24/15  3:24 AM  Result Value Ref Range   Color, Urine YELLOW YELLOW   APPearance CLEAR CLEAR   Specific Gravity, Urine 1.029 1.005 - 1.030   pH 8.5 (H) 5.0 - 8.0   Glucose, UA NEGATIVE  NEGATIVE mg/dL   Hgb urine dipstick NEGATIVE NEGATIVE   Bilirubin Urine NEGATIVE NEGATIVE   Ketones, ur 15 (A) NEGATIVE mg/dL   Protein, ur NEGATIVE NEGATIVE mg/dL   Urobilinogen, UA 0.2 0.0 - 1.0 mg/dL   Nitrite NEGATIVE NEGATIVE   Leukocytes, UA NEGATIVE NEGATIVE    Comment: MICROSCOPIC NOT DONE ON URINES WITH NEGATIVE PROTEIN, BLOOD, LEUKOCYTES, NITRITE, OR GLUCOSE <1000 mg/dL.   Ct Abdomen Pelvis W Contrast  04/24/2015   CLINICAL DATA:  Right lower quadrant abdominal pain  EXAM: CT ABDOMEN AND PELVIS WITH CONTRAST  TECHNIQUE: Multidetector CT imaging of the abdomen and pelvis was performed using the standard protocol following bolus administration of intravenous contrast.  CONTRAST:  145m OMNIPAQUE IOHEXOL 300 MG/ML  SOLN  COMPARISON:  None.  FINDINGS: There is enlargement and inflammation of the appendix. There is an appendicolith in the appendiceal base. The findings are typical of acute appendicitis. There is no abscess. There is no extraluminal air. There is no other acute abnormality in the abdomen or pelvis.  There are normal appearances of the liver, spleen, pancreas, adrenals and kidneys. Small bowel and stomach are normal in appearance. Remainder of the colon is normal.  There is a very small volume free fluid collected dependently in the pelvic peritoneum [place]  There is no significant abnormality in the lower chest. There is no significant musculoskeletal abnormality.  IMPRESSION: Acute appendicitis. These results were called by telephone at the time of interpretation on 04/24/2015 at 3:13 am to Dr. OSharol Given, who verbally acknowledged these results.   Electronically Signed   By: DAndreas NewportM.D.   On: 04/24/2015 03:15    Review of Systems  Constitutional: Negative for weight loss.  HENT: Negative for ear discharge, ear pain, hearing loss and tinnitus.   Eyes: Negative for blurred vision, double vision, photophobia and pain.  Respiratory: Negative for cough, sputum production and  shortness of breath.   Cardiovascular: Negative for chest pain.  Gastrointestinal: Positive for nausea, vomiting and abdominal pain.  Genitourinary: Negative for dysuria, urgency, frequency and flank pain.  Musculoskeletal: Negative for myalgias, back pain, joint pain, falls and neck pain.  Neurological: Negative for dizziness, tingling, sensory change, focal weakness, loss of consciousness and headaches.  Endo/Heme/Allergies: Does not bruise/bleed easily.  Psychiatric/Behavioral: Negative for depression, memory loss and substance abuse. The patient is not nervous/anxious.     Blood pressure 169/104, pulse 68, temperature 97.6 F (36.4 C), temperature source Oral, resp. rate 16, SpO2 97 %. Physical Exam  WDWN in NAD HEENT:  EOMI, sclera anicteric Neck:  No masses, no thyromegaly Lungs:  CTA bilaterally; normal respiratory effort CV:  Regular rate and rhythm; no murmurs Abd:  +bowel sounds, soft, tender in RLQ and suprapubic regions; no palpable masses Ext:  Well-perfused; no edema Skin:  Warm, dry; no sign of jaundice  Assessment/Plan Acute appendicitis  Admit for IV antibiotics, IV hydration NPO Laparoscopic appendectomy later today by Dr. Donne Hazel.  Jared Whorley K. 04/24/2015, 4:37 AM

## 2015-04-24 NOTE — ED Provider Notes (Signed)
CSN: [id number]     Arrival date & time 04/24/15  0049 History  This chart was scribed for non-physician practitioner Francee Piccolo, PA-C working with Marisa Severin, MD by Lyndel Safe, ED Scribe. This patient was seen in room A09C/A09C and the patient's care was started at 1:34 AM.   Chief Complaint  Patient presents with  . Abdominal Pain   HPI HPI Comments: Derek Sullivan is a 24 y.o. male who presents to the Emergency Department complaining of progressively worsening, constant, moderate, RLQ abdominal pain onset 1 day ago with 1 episode of emesis pta. He notes associated nausea. Pt has not taken any alleviating medication pta. Denies blood in emesis, dsyruria, testicular pain, or  PShx on abdomen.   Past Medical History  Diagnosis Date  . Bronchitis    History reviewed. No pertinent past surgical history. No family history on file. History  Substance Use Topics  . Smoking status: Current Every Day Smoker -- 0.00 packs/day    Types: Cigarettes  . Smokeless tobacco: Not on file  . Alcohol Use: Yes     Comment: occasional     Review of Systems  Gastrointestinal: Positive for nausea, vomiting and abdominal pain.  Genitourinary: Negative for dysuria and testicular pain.  All other systems reviewed and are negative.  Allergies  Review of patient's allergies indicates no known allergies.  Home Medications   Prior to Admission medications   Medication Sig Start Date End Date Taking? Authorizing Provider  cyclobenzaprine (FLEXERIL) 10 MG tablet Take 1 tablet (10 mg total) by mouth 2 (two) times daily as needed for muscle spasms. Patient not taking: Reported on 04/24/2015 10/27/14   Felicie Morn, NP  doxycycline (VIBRAMYCIN) 100 MG capsule Take 1 capsule (100 mg total) by mouth 2 (two) times daily. Patient not taking: Reported on 04/24/2015 12/11/13   Jillyn Ledger, PA-C  HYDROcodone-acetaminophen (NORCO/VICODIN) 5-325 MG per tablet Take 1-2 tablets by mouth every 4 (four) hours  as needed for moderate pain. Patient not taking: Reported on 04/24/2015 12/13/13   Ivonne Andrew, PA-C  naproxen (NAPROSYN) 500 MG tablet Take 1 tablet (500 mg total) by mouth 2 (two) times daily. Patient not taking: Reported on 04/24/2015 10/27/14   Felicie Morn, NP   BP 142/89 mmHg  Pulse 65  Temp(Src) 97.6 F (36.4 C) (Oral)  Resp 20  SpO2 100% Physical Exam  Constitutional: He is oriented to person, place, and time. He appears well-developed and well-nourished.  Patient writhing around on stretcher.  HENT:  Head: Normocephalic and atraumatic.  Right Ear: External ear normal.  Left Ear: External ear normal.  Nose: Nose normal.  Eyes: Conjunctivae are normal.  Neck: Neck supple.  Cardiovascular: Normal rate, regular rhythm and normal heart sounds.   Pulmonary/Chest: Effort normal and breath sounds normal.  Abdominal: Soft. Bowel sounds are normal. There is tenderness in the right lower quadrant. There is tenderness at McBurney's point. There is no rigidity.  Neurological: He is alert and oriented to person, place, and time.  Skin: Skin is warm and dry.  Nursing note and vitals reviewed.   ED Course  Procedures  Medications  morphine 4 MG/ML injection 4 mg (4 mg Intravenous Given 04/24/15 0217)  ondansetron (ZOFRAN) injection 4 mg (4 mg Intravenous Given 04/24/15 0218)  sodium chloride 0.9 % bolus 1,000 mL (0 mLs Intravenous Stopped 04/24/15 0327)  iohexol (OMNIPAQUE) 300 MG/ML solution 100 mL (100 mLs Intravenous Contrast Given 04/24/15 0248)  cefOXitin (MEFOXIN) 1 g in dextrose 5 % 50  mL IVPB (1 g Intravenous New Bag/Given 04/24/15 0340)    DIAGNOSTIC STUDIES: Oxygen Saturation is 100% on RA, normal by my interpretation.    COORDINATION OF CARE: 1:39 AM Discussed treatment plan which includes to order fluids, morphine, and zofran with pt. Will also order CT with contrast of abdomen/pelvis. Pt acknowledges and agrees to plan.   Labs Review Labs Reviewed  LIPASE, BLOOD - Abnormal;  Notable for the following:    Lipase 21 (*)    All other components within normal limits  COMPREHENSIVE METABOLIC PANEL - Abnormal; Notable for the following:    Glucose, Bld 102 (*)    All other components within normal limits  CBC - Abnormal; Notable for the following:    WBC 12.0 (*)    All other components within normal limits  URINALYSIS, ROUTINE W REFLEX MICROSCOPIC (NOT AT [facility]) - Abnormal; Notable for the following:    pH 8.5 (*)    Ketones, ur 15 (*)    All other components within normal limits  DIFFERENTIAL    Imaging Review Ct Abdomen Pelvis W Contrast  04/24/2015   CLINICAL DATA:  Right lower quadrant abdominal pain  EXAM: CT ABDOMEN AND PELVIS WITH CONTRAST  TECHNIQUE: Multidetector CT imaging of the abdomen and pelvis was performed using the standard protocol following bolus administration of intravenous contrast.  CONTRAST:  OMNIPAQUE IOHEXOL 300 MG/ML  SOLN  COMPARISON:  None.  FINDINGS: There is enlargement and inflammation of the appendix. There is an appendicolith in the appendiceal base. The findings are typical of acute appendicitis. There is no abscess. There is no extraluminal air. There is no other acute abnormality in the abdomen or pelvis.  There are normal appearances of the liver, spleen, pancreas, adrenals and kidneys. Small bowel and stomach are normal in appearance. Remainder of the colon is normal.  There is a very small volume free fluid collected dependently in the pelvic peritoneum [place].  There is no significant abnormality in the lower chest. There is no significant musculoskeletal abnormality.  IMPRESSION: Acute appendicitis. These results were called by telephone at the time of interpretation on 04/24/2015 at 3:13 am to Dr. Norlene Campbell , who verbally acknowledged these results.   Electronically Signed   By: Ellery Plunk M.D.   On: 04/24/2015 03:15     EKG Interpretation None      3:26 AM Discussed patient with Dr. Harlon Flor who will see the patient,  recommends starting Cefoxitin.   MDM   Final diagnoses:  Acute appendicitis, unspecified acute appendicitis type   Filed Vitals:   04/24/15 0402  BP: 169/104  Pulse: 68  Temp:   Resp: 16   I have reviewed nursing notes, vital signs, and all lab and all imaging results as noted above.  Patient presenting to the ED for acute onset nausea, vomiting, RLQ abdominal pain. On examination patient is uncomfortable appearing. Abdomen soft, RLQ tenderness at McBurney's point. No peritoneal signs. Labs reviewed. CT scan obtained. Radiologist called, acute appendicitis without perforation. General surgery was consulted and will admit the patient. Patient d/w with Dr. Norlene Campbell, agrees with plan.     I personally performed the services described in this documentation, which was scribed in my presence. The recorded information has been reviewed and is accurate.      Francee Piccolo, PA-C 04/24/15 1610  Marisa Severin, MD 04/24/15 [phone]

## 2015-04-24 NOTE — Anesthesia Postprocedure Evaluation (Signed)
Anesthesia Post Note  Patient: Derek Sullivan  Procedure(s) Performed: Procedure(s) (LRB): APPENDECTOMY LAPAROSCOPIC (N/A)  Anesthesia type: general  Patient location: PACU  Post pain: Pain level controlled  Post assessment: Patient's Cardiovascular Status Stable  Last Vitals:  Filed Vitals:   04/24/15 1241  BP: 128/79  Pulse: 67  Temp: 36.8 C  Resp: 18    Post vital signs: Reviewed and stable  Level of consciousness: sedated  Complications: No apparent anesthesia complications

## 2015-04-24 NOTE — Anesthesia Procedure Notes (Signed)
Procedure Name: Intubation Date/Time: 04/24/2015 9:08 AM Performed by: Wray Kearns A Pre-anesthesia Checklist: Patient identified, Timeout performed, Emergency Drugs available, Suction available and Patient being monitored Patient Re-evaluated:Patient Re-evaluated prior to inductionOxygen Delivery Method: Circle system utilized Preoxygenation: Pre-oxygenation with 100% oxygen Intubation Type: IV induction, Rapid sequence and Cricoid Pressure applied Laryngoscope Size: Mac and 4 Grade View: Grade I Tube type: Oral Tube size: 7.5 mm Number of attempts: 1 Airway Equipment and Method: Stylet Placement Confirmation: ETT inserted through vocal cords under direct vision,  breath sounds checked- equal and bilateral and positive ETCO2 Secured at: 23 cm Tube secured with: Tape Dental Injury: Teeth and Oropharynx as per pre-operative assessment

## 2015-04-25 ENCOUNTER — Encounter (HOSPITAL_COMMUNITY): Payer: Self-pay | Admitting: General Surgery

## 2015-04-25 MED ORDER — CHLORPROMAZINE HCL 25 MG/ML IJ SOLN
25.0000 mg | Freq: Once | INTRAMUSCULAR | Status: AC
  Administered 2015-04-25: 25 mg via INTRAMUSCULAR
  Filled 2015-04-25: qty 1

## 2015-04-25 MED ORDER — OXYCODONE-ACETAMINOPHEN 5-325 MG PO TABS: 1.0000 | ORAL_TABLET | Freq: Three times a day (TID) | ORAL | Status: DC | PRN

## 2015-04-25 MED ORDER — HYDROCODONE-ACETAMINOPHEN 5-325 MG PO TABS: 1.0000 | ORAL_TABLET | ORAL | Status: DC | PRN

## 2015-04-25 NOTE — Progress Notes (Signed)
Pt discharged to home.  Discharge instructions explained to pt.  Pt has no questions at the time of discharge.  Pt states he has all belongings.  IV dc'd.  Pt ambulated out of hospital on his own.   

## 2015-04-25 NOTE — Discharge Instructions (Signed)

## 2015-04-25 NOTE — Progress Notes (Signed)
Patient c/o hiccups and pain at his right lateral chest 7/10 and wants medication for hiccups.  Paged on call [facility] MD thru [facility] operator and administered PRN pain medication.  Will closely monitor patient.

## 2015-04-25 NOTE — Progress Notes (Signed)
On call [facility] MD Lindie Spruce called and new order issued for Thorazine 25 mg IM.  Administered Thorazine per order.

## 2015-04-25 NOTE — Discharge Summary (Signed)
Physician Discharge Summary  Patient ID: MILLION MAHARAJ MRN: [id number] DOB/AGE: 25-01-1991 23 y.o.  Admit date: 04/24/2015 Discharge date: 04/25/2015  Admitting Diagnosis: Acute appendicitis   Discharge Diagnosis Patient Active Problem List   Diagnosis Date Noted  . Acute appendicitis 04/24/2015    Consultants none  Imaging: Ct Abdomen Pelvis W Contrast  04/24/2015   CLINICAL DATA:  Right lower quadrant abdominal pain  EXAM: CT ABDOMEN AND PELVIS WITH CONTRAST  TECHNIQUE: Multidetector CT imaging of the abdomen and pelvis was performed using the standard protocol following bolus administration of intravenous contrast.  CONTRAST:  OMNIPAQUE IOHEXOL 300 MG/ML  SOLN  COMPARISON:  None.  FINDINGS: There is enlargement and inflammation of the appendix. There is an appendicolith in the appendiceal base. The findings are typical of acute appendicitis. There is no abscess. There is no extraluminal air. There is no other acute abnormality in the abdomen or pelvis.  There are normal appearances of the liver, spleen, pancreas, adrenals and kidneys. Small bowel and stomach are normal in appearance. Remainder of the colon is normal.  There is a very small volume free fluid collected dependently in the pelvic peritoneum MK.  There is no significant abnormality in the lower chest. There is no significant musculoskeletal abnormality.  IMPRESSION: Acute appendicitis. These results were called by telephone at the time of interpretation on 04/24/2015 at 3:13 am to Dr. Norlene Campbell , who verbally acknowledged these results.   Electronically Signed   By: Ellery Plunk M.D.   On: 04/24/2015 03:15    Procedures Laparoscopic appendectomy---Dr. Marta Lamas Course:  Mykah Shin is a healthy 24 year old male who presented to [facility] with abdominal pain.  Workup showed acute appendicitis by CT.  Patient was admitted and underwent procedure listed above.  Tolerated procedure well and was transferred to the  floor.  Diet was advanced as tolerated.  On POD#1, the patient was voiding well, tolerating diet, ambulating well, pain well controlled, vital signs stable, incisions c/d/i and felt stable for discharge home.  Medication risks, benefits and therapeutic alternatives were reviewed with the patient.  He verbalizes understanding.  Patient will follow up in our office in 3 weeks and knows to call with questions or concerns.  Physical Exam: General:  Alert, NAD, pleasant, comfortable Abd:  Soft, ND, mild tenderness, incisions C/D/I    Medication List    STOP taking these medications        HYDROcodone-acetaminophen 5-325 MG per tablet  Commonly known as:  NORCO/VICODIN      TAKE these medications        cyclobenzaprine 10 MG tablet  Commonly known as:  FLEXERIL  Take 1 tablet (10 mg total) by mouth 2 (two) times daily as needed for muscle spasms.     doxycycline 100 MG capsule  Commonly known as:  VIBRAMYCIN  Take 1 capsule (100 mg total) by mouth 2 (two) times daily.     naproxen 500 MG tablet  Commonly known as:  NAPROSYN  Take 1 tablet (500 mg total) by mouth 2 (two) times daily.     oxyCODONE-acetaminophen 5-325 MG per tablet  Commonly known as:  ROXICET  Take 1-2 tablets by mouth every 8 (eight) hours as needed for severe pain.             Follow-up Information    Follow up with [facility] On 05/21/2015.   Specialty:  General Surgery   Why:  arrive by 1PM for a 1:30PM post operative  check up    Contact information:   [address] [place] [address] [phone]       Signed: Ashok Norris, [facility] [phone]  04/25/2015, 8:10 AM

## 2015-09-22 HISTORY — PX: WISDOM TOOTH EXTRACTION: SHX21

## 2016-03-26 ENCOUNTER — Encounter (HOSPITAL_COMMUNITY): Payer: Self-pay

## 2016-03-26 ENCOUNTER — Emergency Department (HOSPITAL_COMMUNITY)
Admission: EM | Admit: 2016-03-26 | Discharge: 2016-03-26 | Disposition: A | Discharge status: Home / Self Care | Payer: Self-pay | Attending: Emergency Medicine | Admitting: Emergency Medicine

## 2016-03-26 ENCOUNTER — Emergency Department (HOSPITAL_COMMUNITY): Payer: MEDICAID

## 2016-03-26 DIAGNOSIS — Z79899 Other long term (current) drug therapy: Secondary | ICD-10-CM | POA: Insufficient documentation

## 2016-03-26 DIAGNOSIS — F17210 Nicotine dependence, cigarettes, uncomplicated: Secondary | ICD-10-CM | POA: Insufficient documentation

## 2016-03-26 DIAGNOSIS — R112 Nausea with vomiting, unspecified: Secondary | ICD-10-CM | POA: Insufficient documentation

## 2016-03-26 DIAGNOSIS — J189 Pneumonia, unspecified organism: Secondary | ICD-10-CM | POA: Insufficient documentation

## 2016-03-26 LAB — I-STAT TROPONIN, ED: Troponin i, poc: 0 ng/mL (ref 0.00–0.08)

## 2016-03-26 LAB — BASIC METABOLIC PANEL
Anion gap: 5 (ref 5–15)
BUN: 10 mg/dL (ref 6–20)
CHLORIDE: 110 mmol/L (ref 101–111)
CO2: 25 mmol/L (ref 22–32)
Calcium: 9.2 mg/dL (ref 8.9–10.3)
Creatinine, Ser: 0.8 mg/dL (ref 0.61–1.24)
GFR calc non Af Amer: >60 mL/min (ref >60)
Glucose, Bld: 111 mg/dL — ABNORMAL HIGH (ref 65–99)
Potassium: 4 mmol/L (ref 3.5–5.1)
Sodium: 140 mmol/L (ref 135–145)

## 2016-03-26 LAB — CBC
HEMATOCRIT: 41.1 % (ref 39.0–52.0)
Hemoglobin: 14.2 g/dL (ref 13.0–17.0)
MCH: 33 pg (ref 26.0–34.0)
MCHC: 34.5 g/dL (ref 30.0–36.0)
MCV: 95.6 fL (ref 78.0–100.0)
PLATELETS: 274 10*3/uL (ref 150–400)
RBC: 4.3 MIL/uL (ref 4.22–5.81)
RDW: 12.5 % (ref 11.5–15.5)
WBC: 6.3 10*3/uL (ref 4.0–10.5)

## 2016-03-26 LAB — HEPATIC FUNCTION PANEL
ALBUMIN: 3.7 g/dL (ref 3.5–5.0)
ALT: 13 U/L — AB (ref 17–63)
AST: 17 U/L (ref 15–41)
Alkaline Phosphatase: 56 U/L (ref 38–126)
Bilirubin, Direct: <0.1 mg/dL — ABNORMAL LOW (ref 0.1–0.5)
TOTAL PROTEIN: 5.9 g/dL — AB (ref 6.5–8.1)
Total Bilirubin: 0.3 mg/dL (ref 0.3–1.2)

## 2016-03-26 LAB — D-DIMER, QUANTITATIVE: D-Dimer, Quant: 0.31 ug/mL-FEU (ref 0.00–0.50)

## 2016-03-26 LAB — LIPASE, BLOOD: LIPASE: 26 U/L (ref 11–51)

## 2016-03-26 MED ORDER — ONDANSETRON 4 MG PO TBDP: 4.0000 mg | ORAL_TABLET | Freq: Three times a day (TID) | ORAL | Status: DC | PRN

## 2016-03-26 MED ORDER — AZITHROMYCIN 250 MG PO TABS: 250.0000 mg | ORAL_TABLET | Freq: Every day | ORAL | Status: DC

## 2016-03-26 NOTE — Discharge Instructions (Signed)
Community-Acquired Pneumonia, Adult °Pneumonia is an infection of the lungs. There are different types of pneumonia. One type can develop while a person is in a hospital. A different type, called community-acquired pneumonia, develops in people who are not, or have not recently been, in the hospital or other health care facility.  °CAUSES °Pneumonia may be caused by bacteria, viruses, or funguses. Community-acquired pneumonia is often caused by Streptococcus pneumonia bacteria. These bacteria are often passed from one person to another by breathing in droplets from the cough or sneeze of an infected person. °RISK FACTORS °The condition is more likely to develop in: °· People who have chronic diseases, such as chronic obstructive pulmonary disease (COPD), asthma, congestive heart failure, cystic fibrosis, diabetes, or kidney disease. °· People who have early-stage or late-stage HIV. °· People who have sickle cell disease. °· People who have had their spleen removed (splenectomy). °· People who have poor dental hygiene. °· People who have medical conditions that increase the risk of breathing in (aspirating) secretions their own mouth and nose.   °· People who have a weakened immune system (immunocompromised). °· People who smoke. °· People who travel to areas where pneumonia-causing germs commonly exist. °· People who are around animal habitats or animals that have pneumonia-causing germs, including birds, bats, rabbits, cats, and farm animals. °SYMPTOMS °Symptoms of this condition include: °· A dry cough. °· A wet (productive) cough. °· Fever. °· Sweating. °· Chest pain, especially when breathing deeply or coughing. °· Rapid breathing or difficulty breathing. °· Shortness of breath. °· Shaking chills. °· Fatigue. °· Muscle aches. °DIAGNOSIS °Your health care provider will take a medical history and perform a physical exam. You may also have other tests, including: °· Imaging studies of your chest, including  X-rays. °· Tests to check your blood oxygen level and other blood gases. °· Other tests on blood, mucus (sputum), fluid around your lungs (pleural fluid), and urine. °If your pneumonia is severe, other tests may be done to identify the specific cause of your illness. °TREATMENT °The type of treatment that you receive depends on many factors, such as the cause of your pneumonia, the medicines you take, and other medical conditions that you have. For most adults, treatment and recovery from pneumonia may occur at home. In some cases, treatment must happen in a hospital. Treatment may include: °· Antibiotic medicines, if the pneumonia was caused by bacteria. °· Antiviral medicines, if the pneumonia was caused by a virus. °· Medicines that are given by mouth or through an IV tube. °· Oxygen. °· Respiratory therapy. °Although rare, treating severe pneumonia may include: °· Mechanical ventilation. This is done if you are not breathing well on your own and you cannot maintain a safe blood oxygen level. °· Thoracentesis. This procedure removes fluid around one lung or both lungs to help you breathe better. °HOME CARE INSTRUCTIONS °· Take over-the-counter and prescription medicines only as told by your health care provider. °¨ Only take cough medicine if you are losing sleep. Understand that cough medicine can prevent your body's natural ability to remove mucus from your lungs. °¨ If you were prescribed an antibiotic medicine, take it as told by your health care provider. Do not stop taking the antibiotic even if you start to feel better. °· Sleep in a semi-upright position at night. Try sleeping in a reclining chair, or place a few pillows under your head. °· Do not use tobacco products, including cigarettes, chewing tobacco, and e-cigarettes. If you need help quitting, ask your health care provider. °· Drink enough water to keep your urine   clear or pale yellow. This will help to thin out mucus secretions in your  lungs. PREVENTION There are ways that you can decrease your risk of developing community-acquired pneumonia. Consider getting a pneumococcal vaccine if:  You are older than 26 years of age.  You are older than 26 years of age and are undergoing cancer treatment, have chronic lung disease, or have other medical conditions that affect your immune system. Ask your health care provider if this applies to you. There are different types and schedules of pneumococcal vaccines. Ask your health care provider which vaccination option is best for you. You may also prevent community-acquired pneumonia if you take these actions:  Get an influenza vaccine every year. Ask your health care provider which type of influenza vaccine is best for you.  Go to the dentist on a regular basis.  Wash your hands often. Use hand sanitizer if soap and water are not available. SEEK MEDICAL CARE IF:  You have a fever.  You are losing sleep because you cannot control your cough with cough medicine. SEEK IMMEDIATE MEDICAL CARE IF:  You have worsening shortness of breath.  You have increased chest pain.  Your sickness becomes worse, especially if you are an older adult or have a weakened immune system.  You cough up blood.   This information is not intended to replace advice given to you by your health care provider. Make sure you discuss any questions you have with your health care provider.   Document Released: 09/07/2005 Document Revised: 05/29/2015 Document Reviewed: 01/02/2015 Elsevier Interactive Patient Education 2016 Elsevier Inc. Nausea and Vomiting Nausea is a sick feeling that often comes before throwing up (vomiting). Vomiting is a reflex where stomach contents come out of your mouth. Vomiting can cause severe loss of body fluids (dehydration). Children and elderly adults can become dehydrated quickly, especially if they also have diarrhea. Nausea and vomiting are symptoms of a condition or disease. It  is important to find the cause of your symptoms. CAUSES   Direct irritation of the stomach lining. This irritation can result from increased acid production (gastroesophageal reflux disease), infection, food poisoning, taking certain medicines (such as nonsteroidal anti-inflammatory drugs), alcohol use, or tobacco use.  Signals from the brain.These signals could be caused by a headache, heat exposure, an inner ear disturbance, increased pressure in the brain from injury, infection, a tumor, or a concussion, pain, emotional stimulus, or metabolic problems.  An obstruction in the gastrointestinal tract (bowel obstruction).  Illnesses such as diabetes, hepatitis, gallbladder problems, appendicitis, kidney problems, cancer, sepsis, atypical symptoms of a heart attack, or eating disorders.  Medical treatments such as chemotherapy and radiation.  Receiving medicine that makes you sleep (general anesthetic) during surgery. DIAGNOSIS Your caregiver may ask for tests to be done if the problems do not improve after a few days. Tests may also be done if symptoms are severe or if the reason for the nausea and vomiting is not clear. Tests may include:  Urine tests.  Blood tests.  Stool tests.  Cultures (to look for evidence of infection).  X-rays or other imaging studies. Test results can help your caregiver make decisions about treatment or the need for additional tests. TREATMENT You need to stay well hydrated. Drink frequently but in small amounts.You may wish to drink water, sports drinks, clear broth, or eat frozen ice pops or gelatin dessert to help stay hydrated.When you eat, eating slowly may help prevent nausea.There are also some antinausea medicines that may help  prevent nausea. HOME CARE INSTRUCTIONS   Take all medicine as directed by your caregiver.  If you do not have an appetite, do not force yourself to eat. However, you must continue to drink fluids.  If you have an  appetite, eat a normal diet unless your caregiver tells you differently.  Eat a variety of complex carbohydrates (rice, wheat, potatoes, bread), lean meats, yogurt, fruits, and vegetables.  Avoid high-fat foods because they are more difficult to digest.  Drink enough water and fluids to keep your urine clear or pale yellow.  If you are dehydrated, ask your caregiver for specific rehydration instructions. Signs of dehydration may include:  Severe thirst.  Dry lips and mouth.  Dizziness.  Dark urine.  Decreasing urine frequency and amount.  Confusion.  Rapid breathing or pulse. SEEK IMMEDIATE MEDICAL CARE IF:   You have blood or brown flecks (like coffee grounds) in your vomit.  You have black or bloody stools.  You have a severe headache or stiff neck.  You are confused.  You have severe abdominal pain.  You have chest pain or trouble breathing.  You do not urinate at least once every 8 hours.  You develop cold or clammy skin.  You continue to vomit for longer than 24 to 48 hours.  You have a fever. MAKE SURE YOU:   Understand these instructions.  Will watch your condition.  Will get help right away if you are not doing well or get worse.   This information is not intended to replace advice given to you by your health care provider. Make sure you discuss any questions you have with your health care provider.   Document Released: 09/07/2005 Document Revised: 11/30/2011 Document Reviewed: 02/04/2011 Elsevier Interactive Patient Education 2016 Elsevier Inc. High-Fiber Diet Fiber, also called dietary fiber, is a type of carbohydrate found in fruits, vegetables, whole grains, and beans. A high-fiber diet can have many health benefits. Your health care provider may recommend a high-fiber diet to help: Prevent constipation. Fiber can make your bowel movements more regular. Lower your cholesterol. Relieve hemorrhoids, uncomplicated diverticulosis, or irritable  bowel syndrome. Prevent overeating as part of a weight-loss plan. Prevent heart disease, type 2 diabetes, and certain cancers. WHAT IS MY PLAN? The recommended daily intake of fiber includes: 38 grams for men under age 65. 30 grams for men over age 34. 25 grams for women under age 39. 21 grams for women over age 45. You can get the recommended daily intake of dietary fiber by eating a variety of fruits, vegetables, grains, and beans. Your health care provider may also recommend a fiber supplement if it is not possible to get enough fiber through your diet. WHAT DO I NEED TO KNOW ABOUT A HIGH-FIBER DIET? Fiber supplements have not been widely studied for their effectiveness, so it is better to get fiber through food sources. Always check the fiber content on thenutrition facts label of any prepackaged food. Look for foods that contain at least 5 grams of fiber per serving. Ask your dietitian if you have questions about specific foods that are related to your condition, especially if those foods are not listed in the following section. Increase your daily fiber consumption gradually. Increasing your intake of dietary fiber too quickly may cause bloating, cramping, or gas. Drink plenty of water. Water helps you to digest fiber. WHAT FOODS CAN I EAT? Grains Whole-grain breads. Multigrain cereal. Oats and oatmeal. Brown rice. Barley. Bulgur wheat. Millet. Bran muffins. Popcorn. Rye wafer crackers.  Vegetables Sweet potatoes. Spinach. Kale. Artichokes. Cabbage. Broccoli. Green peas. Carrots. Squash. Fruits Berries. Pears. Apples. Oranges. Avocados. Prunes and raisins. Dried figs. Meats and Other Protein Sources Navy, kidney, pinto, and soy beans. Split peas. Lentils. Nuts and seeds. Dairy Fiber-fortified yogurt. Beverages Fiber-fortified soy milk. Fiber-fortified orange juice. Other Fiber bars. The items listed above may not be a complete list of recommended foods or beverages. Contact your  dietitian for more options. WHAT FOODS ARE NOT RECOMMENDED? Grains White bread. Pasta made with refined flour. White rice. Vegetables Fried potatoes. Canned vegetables. Well-cooked vegetables.  Fruits Fruit juice. Cooked, strained fruit. Meats and Other Protein Sources Fatty cuts of meat. Fried Environmental education officerpoultry or fried fish. Dairy Milk. Yogurt. Cream cheese. Sour cream. Beverages Soft drinks. Other Cakes and pastries. Butter and oils. The items listed above may not be a complete list of foods and beverages to avoid. Contact your dietitian for more information. WHAT ARE SOME TIPS FOR INCLUDING HIGH-FIBER FOODS IN MY DIET? Eat a wide variety of high-fiber foods. Make sure that half of all grains consumed each day are whole grains. Replace breads and cereals made from refined flour or white flour with whole-grain breads and cereals. Replace white rice with brown rice, bulgur wheat, or millet. Start the day with a breakfast that is high in fiber, such as a cereal that contains at least 5 grams of fiber per serving. Use beans in place of meat in soups, salads, or pasta. Eat high-fiber snacks, such as berries, raw vegetables, nuts, or popcorn.   This information is not intended to replace advice given to you by your health care provider. Make sure you discuss any questions you have with your health care provider.   Document Released: 09/07/2005 Document Revised: 09/28/2014 Document Reviewed: 02/20/2014 Elsevier Interactive Patient Education [employer] Elsevier Inc.

## 2016-03-26 NOTE — ED Notes (Signed)
Per Pt, Pt reports starting a new job three months ago where he works 10-12 hour every day of the week. Starting on Monday, pt complains that he is fatigued with some dizziness intermittently, SOB, nausea, and headaches. Pt has taken aspirin for headaches with no relief. Reports two episodes of vomiting in the last twenty-four hours. Central-chest pain noted.

## 2016-03-26 NOTE — ED Notes (Signed)
Pt states he understands instructions. Home stable with steady gait. 
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
## 2016-04-11 ENCOUNTER — Encounter (HOSPITAL_COMMUNITY): Payer: Self-pay

## 2016-04-11 ENCOUNTER — Emergency Department (HOSPITAL_COMMUNITY)
Admission: EM | Admit: 2016-04-11 | Discharge: 2016-04-11 | Disposition: A | Discharge status: Home / Self Care | Payer: Self-pay | Attending: Emergency Medicine | Admitting: Emergency Medicine

## 2016-04-11 ENCOUNTER — Emergency Department (HOSPITAL_COMMUNITY): Payer: Self-pay

## 2016-04-11 DIAGNOSIS — R9431 Abnormal electrocardiogram [ECG] [EKG]: Secondary | ICD-10-CM

## 2016-04-11 DIAGNOSIS — R110 Nausea: Secondary | ICD-10-CM

## 2016-04-11 DIAGNOSIS — F17210 Nicotine dependence, cigarettes, uncomplicated: Secondary | ICD-10-CM | POA: Insufficient documentation

## 2016-04-11 DIAGNOSIS — R112 Nausea with vomiting, unspecified: Secondary | ICD-10-CM | POA: Insufficient documentation

## 2016-04-11 LAB — COMPREHENSIVE METABOLIC PANEL
ALBUMIN: 3.8 g/dL (ref 3.5–5.0)
ALT: 15 U/L — AB (ref 17–63)
ANION GAP: 5 (ref 5–15)
AST: 20 U/L (ref 15–41)
Alkaline Phosphatase: 59 U/L (ref 38–126)
BUN: 10 mg/dL (ref 6–20)
CHLORIDE: 109 mmol/L (ref 101–111)
CO2: 25 mmol/L (ref 22–32)
CREATININE: 0.88 mg/dL (ref 0.61–1.24)
Calcium: 9.3 mg/dL (ref 8.9–10.3)
GFR calc non Af Amer: >60 mL/min (ref >60)
GLUCOSE: 98 mg/dL (ref 65–99)
Potassium: 3.9 mmol/L (ref 3.5–5.1)
SODIUM: 139 mmol/L (ref 135–145)
Total Bilirubin: 0.4 mg/dL (ref 0.3–1.2)
Total Protein: 6.1 g/dL — ABNORMAL LOW (ref 6.5–8.1)

## 2016-04-11 LAB — URINE MICROSCOPIC-ADD ON
BACTERIA UA: NONE SEEN
Squamous Epithelial / LPF: NONE SEEN
WBC, UA: NONE SEEN WBC/hpf (ref 0–5)

## 2016-04-11 LAB — URINALYSIS, ROUTINE W REFLEX MICROSCOPIC
Bilirubin Urine: NEGATIVE
GLUCOSE, UA: NEGATIVE mg/dL
Ketones, ur: NEGATIVE mg/dL
Leukocytes, UA: NEGATIVE
Nitrite: NEGATIVE
Protein, ur: NEGATIVE mg/dL
SPECIFIC GRAVITY, URINE: 1.026 (ref 1.005–1.030)
pH: 6 (ref 5.0–8.0)

## 2016-04-11 LAB — CBC
HCT: 41.8 % (ref 39.0–52.0)
HEMOGLOBIN: 13.7 g/dL (ref 13.0–17.0)
MCH: 31.4 pg (ref 26.0–34.0)
MCHC: 32.8 g/dL (ref 30.0–36.0)
MCV: 95.9 fL (ref 78.0–100.0)
Platelets: 320 10*3/uL (ref 150–400)
RBC: 4.36 MIL/uL (ref 4.22–5.81)
RDW: 12.3 % (ref 11.5–15.5)
WBC: 8 10*3/uL (ref 4.0–10.5)

## 2016-04-11 LAB — I-STAT TROPONIN, ED: TROPONIN I, POC: 0 ng/mL (ref 0.00–0.08)

## 2016-04-11 LAB — LIPASE, BLOOD: LIPASE: 33 U/L (ref 11–51)

## 2016-04-11 MED ORDER — SODIUM CHLORIDE 0.9 % IV BOLUS (SEPSIS)
1000.0000 mL | Freq: Once | INTRAVENOUS | Status: AC
  Administered 2016-04-11: 1000 mL via INTRAVENOUS

## 2016-04-11 NOTE — ED Notes (Signed)
Removed pt's IV, per [facility] - RN.

## 2016-04-11 NOTE — Discharge Instructions (Signed)
Your lab work today is unremarkable.  However, your EKG which looks at your heart is slightly abnormal.  This needs to be further evaluated by Cardiology and you will likely need an echo.  Heat Exhaustion Information  WHAT IS HEAT EXHAUSTION?  Heat exhaustion happens when your body gets overheated from hot weather or from exercise. Heat exhaustion makes the temperature of your skin and body go up.  Your body cools itself by sweating. If you do not drink enough water to replace what you sweat, you lose too much water and salt. This makes it harder for your body to produce more sweat. When you do not sweat enough, your body cannot cool down, and heat exhaustion may result.  Heat exhaustion can lead to heatstroke, which is a more serious illness.  WHO IS AT RISK FOR HEAT EXHAUSTION?  Anyone can get heat exhaustion. However, heat exhaustion is more likely when you are exercising or doing a physical activity. It is also more likely when you are in hot and humid weather or bright sunshine.  Heat exhaustion is also more likely to develop in:  People who are age 26 or older.  Children.  People who have a medical condition such as heart disease, poor circulation, sickle cell disease, or high blood pressure.  People who have a fever.  People who are very overweight (obese).  People who are dehydrated from:  Drinking alcohol or caffeine.  Taking certain medicines, such as diuretics or stimulants. WHAT ARE THE SYMPTOMS OF HEAT EXHAUSTION?  Symptoms of heat exhaustion include:  A body temperature of up to 104F (40C).  Moist, cool, and clammy skin.  Dizziness.  Headache.  Nausea.  Fatigue.  Thirst.  Dark-colored urine.  Rapid pulse or heartbeat.  Weakness.  Muscle cramps.  Confusion.  Fainting. WHAT SHOULD I DO IF I THINK I HAVE HEAT EXHAUSTION?  If you think that you have heat exhaustion, call your health care provider. Follow his or her instructions. You should also:  Call a friend or a  family member and ask someone to stay with you.  Move to a cooler location, such as:  Into the shade.  In front of a fan.  Someplace that has air conditioning. Lie down and rest.  Slowly drink nonalcoholic, caffeine-free fluids.  Take off any extra clothing or tight-fitting clothes.  Take a cool bath or shower, if possible. If you do not have access to a bath or shower, dab or mist cool water on your skin. WHY IS IT IMPORTANT TO TREAT HEAT EXHAUSTION?  It is extremely important to take care of yourself and treat heat exhaustion as soon as possible. Untreated heat exhaustion can turn into heatstroke. Symptoms of heatstroke include:  A body temperature of 104F (40C) or higher.  Hot, red skin that may be dry or moist.  Severe headache.  Nausea and vomiting.  Muscle weakness and cramping.  Confusion.  Rapid breathing.  Fainting.  Seizure. These symptoms may represent a serious problem that is an emergency. Do not wait to see if the symptoms will go away. Get medical help right away. Call your local emergency services (911 in the [place] Do not drive yourself to the hospital.  Heatstroke is a life-threatening condition that requires urgent medical treatment. Do not treat heatstroke at home. Heatstroke should be treated by a health care professional and may require hospitalization. At the hospital, you may need to receive fluids through an IV tube:  If you cannot drink any fluids.  If you vomit any fluids that you drink.  If your symptoms do not get better after one hour.  If your symptoms get worse after one hour. HOW CAN I PREVENT HEAT EXHAUSTION?  Avoid outdoor activities on very hot or humid days.  Do not exercise or do other physical activity when you are not feeling well.  Drink plenty of nonalcoholic and caffeine-free fluids before and during physical activity.  Take frequent breaks for rest during physical activity.  Wear light-colored, loose-fitting, and lightweight clothing in the  heat.  Wear a hat and use sunscreen when exercising outdoors.  Avoid being outside during the hottest times of the day.  Check with your health care provider before you start any new activity, especially if you take medicine or have a medical condition.  Start any new activity slowly and work up to your fitness level. HOW CAN I HELP TO PROTECT ELDERLY RELATIVES AND NEIGHBORS FROM HEAT EXHAUSTION?  People who are age 26 or older are at greater risk for heat exhaustion. Their bodies have a harder time adjusting to heat. They are also more likely to have a medical condition or be on medicines that increase their risk for heat exhaustion. They may get heat exhaustion indoors if the heat is high for several days. You can help to protect them during hot weather by:  Checking on them two or more times each day.  Making sure that they are drinking plenty of cool, nonalcoholic, and caffeine-free fluids.  Making sure that they use their air conditioner.  Taking them to a location where air conditioning is available.  Talking with their health care provider about their medical needs, medicines, and fluid requirements. This information is not intended to replace advice given to you by your health care provider. Make sure you discuss any questions you have with your health care provider.  Document Released: 06/16/2008 Document Revised: 05/29/2015 Document Reviewed: 08/15/2014  Elsevier Interactive Patient Education [employer]

## 2016-04-11 NOTE — ED Notes (Signed)
Patient here with recurrent vomiting since yesterday. States that he has had same in past, thinks related to heat. Works in [employer]. Alert and oriented, no vomiting on arrival, denies pain

## 2016-04-11 NOTE — ED Provider Notes (Signed)
CSN: [id number]     Arrival date & time 04/11/16  1478 History   First MD Initiated Contact with Patient 04/11/16 1045     Chief Complaint  Patient presents with  . Emesis     (Consider location/radiation/quality/duration/timing/severity/associated sxs/prior Treatment) HPI Derek Sullivan is a 25 y.o. male with no significant PMH who presents for evaluation of emesis. Patient reports last night he was at work when he began experiencing NBNB emesis with associated lightheadedness. 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 Patient reports this is what he was doing when he became extremely overheated, lightheaded, and began to feel nauseous. He reports 5 episodes of emesis, with the last being approximately 9:30 PM yesterday.  He states he has the Zofran provided from his last ED visit, but has not gotten that filled. Patient reports he has been drinking water and has had normal by mouth intake. At the time and currently he denies chest pain, shortness of breath, abdominal pain, cough, fever, chills. Patient reports he was seen earlier this month and discharged home with azithromycin for CAP. He states the symptoms have completely resolved. He denies current nausea. He reports normal BMs. He tried Sprite and ginger ale with relief.   Past Medical History  Diagnosis Date  . Chronic bronchitis (HCC)   . Headache     reports frequency depends on my blood pressure (04/24/2015)  . Migraine     reports frequency depends on my blood pressure (04/24/2015)  . Depression     "don't take RX for it; I'll take care of it on my own" (04/24/2015)   Past Surgical History  Procedure Laterality Date  . Appendectomy  04/24/2015  . Tonsillectomy    . Laparoscopic appendectomy N/A 04/24/2015    Procedure: APPENDECTOMY LAPAROSCOPIC;  Surgeon: Emelia Loron, MD;  Location: [facility] OR;  Service: General;  Laterality: N/A;   No family history on file. Social  History  Substance Use Topics  . Smoking status: Current Every Day Smoker -- 1.00 packs/day for 8 years    Types: Cigarettes  . Smokeless tobacco: Never Used  . Alcohol Use: Yes     Comment: 04/24/2015 "might have 3-4 beers a few times/month; I'm a social drinker"    Review of Systems All other systems negative unless otherwise stated in HPI    Allergies  Review of patient's allergies indicates no known allergies.  Home Medications   Prior to Admission medications   Medication Sig Start Date End Date Taking? Authorizing Provider  acetaminophen (TYLENOL) 500 MG tablet Take 1,000 mg by mouth every 6 (six) hours as needed for mild pain or headache.    Historical Provider, MD  azithromycin (ZITHROMAX) 250 MG tablet Take 1 tablet (250 mg total) by mouth daily. Take first 2 tablets together, then 1 every day until finished. 03/26/16   Everlene Farrier, PA-C  cyclobenzaprine (FLEXERIL) 10 MG tablet Take 1 tablet (10 mg total) by mouth 2 (two) times daily as needed for muscle spasms. Patient not taking: Reported on 04/24/2015 10/27/14   Felicie Morn, NP  doxycycline (VIBRAMYCIN) 100 MG capsule Take 1 capsule (100 mg total) by mouth 2 (two) times daily. Patient not taking: Reported on 04/24/2015 12/11/13   Jillyn Ledger, PA-C  ibuprofen (ADVIL,MOTRIN) 200 MG tablet Take 400 mg by mouth every 6 (six) hours as needed for headache.    Historical Provider, MD  naproxen (NAPROSYN) 500 MG  tablet Take 1 tablet (500 mg total) by mouth 2 (two) times daily. Patient not taking: Reported on 04/24/2015 10/27/14   Felicie Morn, NP  naproxen sodium (ALEVE) 220 MG tablet Take 220-440 mg by mouth 2 (two) times daily as needed (for pain or headaches).    Historical Provider, MD  ondansetron (ZOFRAN ODT) 4 MG disintegrating tablet Take 1 tablet (4 mg total) by mouth every 8 (eight) hours as needed for nausea or vomiting. 03/26/16   Everlene Farrier, PA-C  oxyCODONE-acetaminophen (ROXICET) 5-325 MG per tablet Take 1-2 tablets by  mouth every 8 (eight) hours as needed for severe pain. Patient not taking: Reported on 03/26/2016 04/25/15   Emina Riebock, NP   BP 115/82 mmHg  Pulse 51  Temp(Src) 97.9 F (36.6 C) (Oral)  Resp 17  SpO2 100% Physical Exam  Constitutional: He is oriented to person, place, and time. He appears well-developed and well-nourished.  Non-toxic appearance. He does not have a sickly appearance. He does not appear ill.  HENT:  Head: Normocephalic and atraumatic.  Mouth/Throat: Oropharynx is clear and moist.  Eyes: Conjunctivae are normal.  Neck: Normal range of motion. Neck supple.  Cardiovascular: Normal rate and regular rhythm.   Pulmonary/Chest: Effort normal and breath sounds normal. No accessory muscle usage or stridor. No respiratory distress. He has no wheezes. He has no rhonchi. He has no rales.  Abdominal: Soft. Bowel sounds are normal. He exhibits no distension. There is no tenderness.  Musculoskeletal: Normal range of motion.  Lymphadenopathy:    He has no cervical adenopathy.  Neurological: He is alert and oriented to person, place, and time.  Speech clear without dysarthria.  Skin: Skin is warm and dry.  Psychiatric: He has a normal mood and affect. His behavior is normal.    ED Course  Procedures (including critical care time) Labs Review Labs Reviewed  COMPREHENSIVE METABOLIC PANEL - Abnormal; Notable for the following:    Total Protein 6.1 (*)    ALT 15 (*)    All other components within normal limits  URINALYSIS, ROUTINE W REFLEX MICROSCOPIC (NOT AT [facility]) - Abnormal; Notable for the following:    Hgb urine dipstick SMALL (*)    All other components within normal limits  LIPASE, BLOOD  CBC  URINE MICROSCOPIC-ADD ON  Rosezena Sensor, ED    Imaging Review Dg Chest 2 View  04/11/2016  CLINICAL DATA:  Chest pain. EXAM: CHEST  2 VIEW COMPARISON:  03/26/2016 FINDINGS: The heart size and mediastinal contours are within normal limits. There is no evidence of pulmonary  edema, consolidation, pneumothorax, nodule or pleural fluid. The visualized skeletal structures are unremarkable. IMPRESSION: No active cardiopulmonary disease. Electronically Signed   By: Irish Lack M.D.   On: 04/11/2016 15:12   I have personally reviewed and evaluated these images and lab results as part of my medical decision-making.   EKG Interpretation   Date/Time:  Saturday April 11 2016 11:05:59 EDT Ventricular Rate:  55 PR Interval:    QRS Duration: 81 QT Interval:  447 QTC Calculation: 428 R Axis:   90 Text Interpretation:  Sinus rhythm Borderline right axis deviation ST  elev, probable normal early repol pattern Confirmed by YELVERTON  MD,  DAVID ([id number]) on 04/11/2016 3:52:18 PM      MDM   Final diagnoses:  Nausea  Abnormal EKG   Patient presents with now resolved nausea and vomiting last night when he became overheated at work. Patient with 60 pounds of steel for 10-12 hours a  day 7 days a week. He reports he tries to drink plenty of water, but it is extremely hot where he works. At the time he became nauseous after he had been sweating for a long period of time and became lightheaded. This has all resolved. He denies any chest pain, shortness of breath, abdominal pain, cough. He is currently symptom-free. Likely related to heat exhaustion.  Labs without acute abnormalities. EKG abnormal.  Troponin 0.00.  CXR normal.  Patient received 1 L IV fluids in ED. Patient has a prescription of Zofran from previous ED visit.  Discussed follow up cardiology for likely echocardiogram.  Return precautions discussed.  Patient agrees and acknowledges the above plan for discharge.  Case has been discussed with Dr. Ranae Palms who agrees with the above plan for discharge.      Cheri Fowler, PA-C 04/11/16 1602   Loren Racer, MD 04/26/16 [phone]

## 2016-04-11 NOTE — ED Notes (Signed)
Pt asked to step outside for a cigarette before triage and was informed that if he is called and does not answer, the next pt will be called instead. Pt verbalized understanding.

## 2016-04-14 ENCOUNTER — Encounter: Payer: Self-pay | Admitting: Cardiovascular Disease

## 2016-04-14 ENCOUNTER — Ambulatory Visit (INDEPENDENT_AMBULATORY_CARE_PROVIDER_SITE_OTHER): Payer: Self-pay | Admitting: Cardiovascular Disease

## 2016-04-14 VITALS — BP 110/78 | HR 74 | Ht 76.0 in | Wt 247.0 lb

## 2016-04-14 DIAGNOSIS — R9431 Abnormal electrocardiogram [ECG] [EKG]: Secondary | ICD-10-CM

## 2016-04-14 NOTE — Patient Instructions (Signed)
FOLLOW UP AS NEEDED AT THIS TIME

## 2016-04-14 NOTE — Progress Notes (Signed)
Chief Complaint  Patient presents with  . Dizziness   History of Present Illness: 25 yo male with history of migraine headaches, recent pneumonia, depression who is here today as a new patient for cardiac evaluation. He was seen in the ED at [facility] with c/o dizziness, emesis, dyspnea. 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 It was felt that he had heat exhaustion. EKG with changes c/w early repolarization.   He tells me today that he works most days with no issues. He lifts steel and has no chest pain. The extreme heat in his warehouse over the last two weeks has been difficult. His symptoms were related to overexertion at work. He has no chest pain. No palpitations.   Primary Care Physician: No PCP Per Patient   Past Medical History:  Diagnosis Date  . Chronic bronchitis (HCC)   . Depression    "don't take RX for it; I'll take care of it on my own" (04/24/2015)  . Headache    reports frequency depends on my blood pressure (04/24/2015)  . Migraine    reports frequency depends on my blood pressure (04/24/2015)  . Syncope and collapse     Past Surgical History:  Procedure Laterality Date  . APPENDECTOMY  04/24/2015  . LAPAROSCOPIC APPENDECTOMY N/A 04/24/2015   Procedure: APPENDECTOMY LAPAROSCOPIC;  Surgeon: Emelia Loron, MD;  Location: [facility] OR;  Service: General;  Laterality: N/A;  . TONSILLECTOMY      No current outpatient prescriptions on file.   No current facility-administered medications for this visit.     Allergies  Allergen Reactions  . Oxycodone Hives    Pt unsure of reaction but thinks its hives    Social History   Social History  . Marital status: Single    Spouse name: N/A  . Number of children: N/A  . Years of education: N/A   Occupational History  . Not on file.   Social History Main Topics  . Smoking status: Current Every Day Smoker    Packs/day: 1.00    Years: 8.00    Types: Cigarettes  . Smokeless  tobacco: Never Used  . Alcohol use Yes     Comment: 04/24/2015 "might have 3-4 beers a few times/month; I'm a social drinker"  . Drug use: No     Comment: 04/24/2015 "a few times/wk"  . Sexual activity: Yes   Other Topics Concern  . Not on file   Social History Narrative  . No narrative on file    Family History  Problem Relation Age of Onset  . Heart disease Maternal Grandmother     Review of Systems:  As stated in the HPI and otherwise negative.   BP 110/78   Pulse 74   Ht 6\' 4"  (1.93 m)   Wt 247 lb (112 kg)   SpO2 97%   BMI 30.07 kg/m   Physical Examination: General: Well developed, well nourished, NAD  HEENT: OP clear, mucus membranes moist  SKIN: warm, dry. No rashes. Neuro: No focal deficits  Musculoskeletal: Muscle strength 5/5 all ext  Psychiatric: Mood and affect normal  Neck: No JVD, no carotid bruits, no thyromegaly, no lymphadenopathy.  Lungs:Clear bilaterally, no wheezes, rhonci, crackles Cardiovascular: Regular rate and rhythm. No murmurs, gallops or rubs. Abdomen:Soft. Bowel sounds present. Non-tender.  Extremities: No lower extremity edema. Pulses are 2 + in the bilateral DP/PT.  EKG:  EKG is not ordered today. The ekg from 04/11/16  is reviewed by me today and shows sinus with ST changes c/w early repolarization    Recent Labs: 04/11/2016: ALT 15; BUN 10; Creatinine, Ser 0.88; Hemoglobin 13.7; Platelets 320; Potassium 3.9; Sodium 139   Lipid Panel No results found for: CHOL, TRIG, HDL, CHOLHDL, VLDL, LDLCALC, LDLDIRECT   Wt Readings from Last 3 Encounters:  04/14/16 247 lb (112 kg)  03/26/16 260 lb (117.9 kg)  04/24/15 281 lb 15.5 oz (127.9 kg)     Other studies Reviewed: Additional studies/ records that were reviewed today include: . Review of the above records demonstrates:   Assessment and Plan:   1. Abnormal EKG: The changes on his EKG appear to be related to early repolarization. This is consistent with prior EKGs. His symptoms seem to be  related to the extreme heat at work. I have discussed an echo today to assess LV size and function but he refuses due to cost. He does not currently has insurance. I think this is reasonable to avoid but if symptoms recur, should plan echo. I have written a letter that he will give to his employer stating he should avoid extreme heat.   Current medicines are reviewed at length with the patient today.  The patient does not have concerns regarding medicines.  The following changes have been made:  no change  Labs/ tests ordered today include:  No orders of the defined types were placed in this encounter.  Disposition:   FU with me as needed.    Signed, Verne Carrow, MD 04/14/2016 9:47 AM    [facility] [address] [place], [place], [place]  [address] Phone: [phone]; Fax: [phone]

## 2016-07-05 DIAGNOSIS — F17210 Nicotine dependence, cigarettes, uncomplicated: Secondary | ICD-10-CM | POA: Insufficient documentation

## 2016-07-05 DIAGNOSIS — K0889 Other specified disorders of teeth and supporting structures: Secondary | ICD-10-CM | POA: Insufficient documentation

## 2016-07-06 ENCOUNTER — Emergency Department (HOSPITAL_COMMUNITY)
Admission: EM | Admit: 2016-07-06 | Discharge: 2016-07-06 | Disposition: A | Discharge status: Home / Self Care | Payer: Self-pay | Attending: Emergency Medicine | Admitting: Emergency Medicine

## 2016-07-06 ENCOUNTER — Encounter (HOSPITAL_COMMUNITY): Payer: Self-pay

## 2016-07-06 DIAGNOSIS — K0889 Other specified disorders of teeth and supporting structures: Secondary | ICD-10-CM

## 2016-07-06 MED ORDER — ACETAMINOPHEN 325 MG PO TABS: 650.0000 mg | ORAL_TABLET | Freq: Once | ORAL | Status: DC

## 2016-07-06 MED ORDER — PENICILLIN V POTASSIUM 500 MG PO TABS: 500.0000 mg | ORAL_TABLET | Freq: Four times a day (QID) | ORAL | 0 refills | Status: DC

## 2016-07-06 MED ORDER — NAPROXEN 500 MG PO TABS: 500.0000 mg | ORAL_TABLET | Freq: Two times a day (BID) | ORAL | 0 refills | Status: DC

## 2016-07-06 NOTE — ED Provider Notes (Signed)
[facility]-EMERGENCY DEPT Provider Note   CSN: [id number] Arrival date & time: 07/05/16  2350     History   Chief [id number] Chief Complaint  Patient presents with  . Dental Pain    HPI Derek Sullivan is a 25 y.o. male.  Derek OmsLashawn B Sullivan is a 25 y.o. Male who presents to the ED with dental pain since tonight. Patient complains of pain to his right lower molar where he has bad dental cavities. He reports he's been taking some ibuprofen with only little relief of his pain. He denies any fevers. No sore throat or trouble swallowing. He does not have a dental provider. He denies fevers, sore throat, trouble swallowing, neck pain, neck stiffness, ear pain, or rashes.   The history is provided by the patient. No language interpreter was used.  Dental Pain      Past Medical History:  Diagnosis Date  . Chronic bronchitis (HCC)   . Depression    "don't take RX for it; I'll take care of it on my own" (04/24/2015)  . Headache    reports frequency depends on my blood pressure (04/24/2015)  . Migraine    reports frequency depends on my blood pressure (04/24/2015)  . Syncope and collapse     Patient Active Problem List   Diagnosis Date Noted  . Acute appendicitis 04/24/2015    Past Surgical History:  Procedure Laterality Date  . APPENDECTOMY  04/24/2015  . LAPAROSCOPIC APPENDECTOMY N/A 04/24/2015   Procedure: APPENDECTOMY LAPAROSCOPIC;  Surgeon: Emelia LoronMatthew Wakefield, MD;  Location: [facility] OR;  Service: General;  Laterality: N/A;  . TONSILLECTOMY         Home Medications    Prior to Admission medications   Medication Sig Start Date End Date Taking? Authorizing Provider  naproxen (NAPROSYN) 500 MG tablet Take 1 tablet (500 mg total) by mouth 2 (two) times daily with a meal. 07/06/16   Everlene FarrierWilliam Abdulwahab Demelo, PA-C  penicillin v potassium (VEETID) 500 MG tablet Take 1 tablet (500 mg total) by mouth 4 (four) times daily. 07/06/16   Everlene FarrierWilliam Ercia Crisafulli, PA-C    Family History Family History  Problem  Relation Age of Onset  . Heart disease Maternal Grandmother     Social History Social History  Substance Use Topics  . Smoking status: Current Every Day Smoker    Packs/day: 1.00    Years: 8.00    Types: Cigarettes  . Smokeless tobacco: Never Used  . Alcohol use Yes     Comment: 04/24/2015 "might have 3-4 beers a few times/month; I'm a social drinker"     Allergies   Oxycodone   Review of Systems Review of Systems  Constitutional: Negative for fever.  HENT: Positive for dental problem. Negative for drooling, ear pain, facial swelling, sore throat and trouble swallowing.   Eyes: Negative for pain and visual disturbance.  Musculoskeletal: Negative for neck pain and neck stiffness.  Skin: Negative for rash.  Neurological: Negative for headaches.     Physical Exam Updated Vital Signs BP (!) 148/102 (BP Location: Left Arm)   Pulse 65   Temp 97.6 F (36.4 C) (Oral)   Resp 20   SpO2 100%   Physical Exam  Constitutional: He is oriented to person, place, and time. He appears well-developed and well-nourished. No distress.  Non-toxic appearing.   HENT:  Head: Normocephalic and atraumatic.  Right Ear: External ear normal.  Left Ear: External ear normal.  Mouth/Throat: Oropharynx is clear and moist. No oropharyngeal exudate.  Tenderness to right  lower molar where there is a cracked molar. No discharge from the mouth. No facial swelling.  Uvula is midline without edema. Soft palate rises symmetrically. No tonsillar hypertrophy or exudates. Tongue protrusion is normal. No trismus. No drooling. No abscess. No peritonsillar abscess.  Eyes: Conjunctivae and EOM are normal. Pupils are equal, round, and reactive to light. Right eye exhibits no discharge. Left eye exhibits no discharge.  Neck: Normal range of motion. Neck supple. No JVD present. No tracheal deviation present.  Cardiovascular: Normal rate, normal heart sounds and intact distal pulses.   Pulmonary/Chest: Effort normal  and breath sounds normal. No respiratory distress.  Abdominal: Soft. There is no tenderness.  Lymphadenopathy:    He has no cervical adenopathy.  Neurological: He is alert and oriented to person, place, and time. Coordination normal.  Skin: Skin is warm and dry. Capillary refill takes less than 2 seconds. No rash noted. He is not diaphoretic. No erythema. No pallor.  Psychiatric: He has a normal mood and affect. His behavior is normal.  Nursing note and vitals reviewed.    ED Treatments / Results  Labs (all labs ordered are listed, but only abnormal results are displayed) Labs Reviewed - No data to display  EKG  EKG Interpretation None       Radiology No results found.  Procedures Procedures (including critical care time)  Medications Ordered in ED Medications - No data to display   Initial Impression / Assessment and Plan / ED Course  I have reviewed the triage vital signs and the nursing notes.  Pertinent labs & imaging results that were available during my care of the patient were reviewed by me and considered in my medical decision making (see chart for details).  Clinical Course    Patient with right lower molar toothache since tonight.  No gross abscess.  Exam unconcerning for Ludwig's angina or spread of infection.  Will treat with penicillin and pain medicine.  Urged patient to follow-up with dentist Dr. Michiel Sites. I advised the patient to follow-up with their primary care provider this week. I advised the patient to return to the emergency department with new or worsening symptoms or new concerns. The patient verbalized understanding and agreement with plan.     Final Clinical Impressions(s) / ED Diagnoses   Final diagnoses:  Pain, dental    New Prescriptions New Prescriptions   NAPROXEN (NAPROSYN) 500 MG TABLET    Take 1 tablet (500 mg total) by mouth 2 (two) times daily with a meal.   PENICILLIN V POTASSIUM (VEETID) 500 MG TABLET    Take 1 tablet (500 mg  total) by mouth 4 (four) times daily.     Everlene Farrier, PA-C 07/06/16 0012    Gilda Crease, MD 07/07/16 [phone]

## 2016-07-06 NOTE — ED Triage Notes (Signed)
Pt complaining of dental pain. Pt states ongoing x days. Pt with poor dental hygiene. A/o x 4.

## 2017-02-20 ENCOUNTER — Inpatient Hospital Stay (HOSPITAL_COMMUNITY): Payer: BLUE CROSS/BLUE SHIELD | Admitting: Certified Registered Nurse Anesthetist

## 2017-02-20 ENCOUNTER — Emergency Department (HOSPITAL_COMMUNITY): Payer: BLUE CROSS/BLUE SHIELD

## 2017-02-20 ENCOUNTER — Encounter (HOSPITAL_COMMUNITY): Payer: Self-pay | Admitting: Emergency Medicine

## 2017-02-20 ENCOUNTER — Inpatient Hospital Stay (HOSPITAL_COMMUNITY): Payer: BLUE CROSS/BLUE SHIELD

## 2017-02-20 ENCOUNTER — Inpatient Hospital Stay (HOSPITAL_COMMUNITY)
Admission: EM | Admit: 2017-02-20 | Discharge: 2017-02-24 | DRG: 472 | Disposition: A | Discharge status: Home / Self Care | Payer: BLUE CROSS/BLUE SHIELD | Attending: Neurological Surgery | Admitting: Neurological Surgery

## 2017-02-20 ENCOUNTER — Inpatient Hospital Stay (HOSPITAL_COMMUNITY): Admission: EM | Disposition: A | Discharge status: Home / Self Care | Payer: Self-pay | Source: Home / Self Care

## 2017-02-20 DIAGNOSIS — T80818A Extravasation of other vesicant agent, initial encounter: Secondary | ICD-10-CM | POA: Diagnosis not present

## 2017-02-20 DIAGNOSIS — S14109A Unspecified injury at unspecified level of cervical spinal cord, initial encounter: Secondary | ICD-10-CM

## 2017-02-20 DIAGNOSIS — S129XXA Fracture of neck, unspecified, initial encounter: Secondary | ICD-10-CM | POA: Diagnosis present

## 2017-02-20 DIAGNOSIS — T1490XA Injury, unspecified, initial encounter: Secondary | ICD-10-CM

## 2017-02-20 DIAGNOSIS — Y92410 Unspecified street and highway as the place of occurrence of the external cause: Secondary | ICD-10-CM

## 2017-02-20 DIAGNOSIS — D72829 Elevated white blood cell count, unspecified: Secondary | ICD-10-CM | POA: Diagnosis present

## 2017-02-20 DIAGNOSIS — S12500A Unspecified displaced fracture of sixth cervical vertebra, initial encounter for closed fracture: Secondary | ICD-10-CM | POA: Diagnosis present

## 2017-02-20 DIAGNOSIS — T07XXXA Unspecified multiple injuries, initial encounter: Secondary | ICD-10-CM

## 2017-02-20 DIAGNOSIS — Y848 Other medical procedures as the cause of abnormal reaction of the patient, or of later complication, without mention of misadventure at the time of the procedure: Secondary | ICD-10-CM | POA: Diagnosis not present

## 2017-02-20 DIAGNOSIS — S12600A Unspecified displaced fracture of seventh cervical vertebra, initial encounter for closed fracture: Secondary | ICD-10-CM | POA: Diagnosis present

## 2017-02-20 DIAGNOSIS — K029 Dental caries, unspecified: Secondary | ICD-10-CM | POA: Diagnosis present

## 2017-02-20 DIAGNOSIS — Z419 Encounter for procedure for purposes other than remedying health state, unspecified: Secondary | ICD-10-CM

## 2017-02-20 DIAGNOSIS — M542 Cervicalgia: Secondary | ICD-10-CM | POA: Diagnosis present

## 2017-02-20 HISTORY — PX: ANTERIOR CERVICAL DECOMP/DISCECTOMY FUSION: SHX1161

## 2017-02-20 HISTORY — DX: Essential (primary) hypertension: I10

## 2017-02-20 LAB — I-STAT CHEM 8, ED
BUN: 12 mg/dL (ref 6–20)
CREATININE: 1.1 mg/dL (ref 0.61–1.24)
Calcium, Ion: 1.18 mmol/L (ref 1.15–1.40)
Chloride: 109 mmol/L (ref 101–111)
GLUCOSE: 113 mg/dL — AB (ref 65–99)
HCT: 41 % (ref 39.0–52.0)
HEMOGLOBIN: 13.9 g/dL (ref 13.0–17.0)
POTASSIUM: 3.4 mmol/L — AB (ref 3.5–5.1)
Sodium: 143 mmol/L (ref 135–145)
TCO2: 21 mmol/L (ref 0–100)

## 2017-02-20 LAB — COMPREHENSIVE METABOLIC PANEL
ALT: 23 U/L (ref 17–63)
AST: 35 U/L (ref 15–41)
Albumin: 4.1 g/dL (ref 3.5–5.0)
Alkaline Phosphatase: 61 U/L (ref 38–126)
Anion gap: 14 (ref 5–15)
BILIRUBIN TOTAL: 1 mg/dL (ref 0.3–1.2)
BUN: 12 mg/dL (ref 6–20)
CALCIUM: 9.6 mg/dL (ref 8.9–10.3)
CHLORIDE: 109 mmol/L (ref 101–111)
CO2: 17 mmol/L — ABNORMAL LOW (ref 22–32)
CREATININE: 1.03 mg/dL (ref 0.61–1.24)
GFR calc Af Amer: >60 mL/min (ref >60)
Glucose, Bld: 110 mg/dL — ABNORMAL HIGH (ref 65–99)
Potassium: 3.5 mmol/L (ref 3.5–5.1)
Sodium: 140 mmol/L (ref 135–145)
TOTAL PROTEIN: 6.9 g/dL (ref 6.5–8.1)

## 2017-02-20 LAB — TYPE AND SCREEN
ABO/RH(D): B POS
Antibody Screen: NEGATIVE
Unit division: 0
Unit division: 0

## 2017-02-20 LAB — BPAM FFP
BLOOD PRODUCT EXPIRATION DATE: 201806232359
BLOOD PRODUCT EXPIRATION DATE: 201806232359
ISSUE DATE / TIME: 201806021311
ISSUE DATE / TIME: 201806021311
UNIT TYPE AND RH: 600
Unit Type and Rh: 600

## 2017-02-20 LAB — BPAM RBC
Blood Product Expiration Date: 201806192359
Blood Product Expiration Date: 201806232359
ISSUE DATE / TIME: 201806021309
ISSUE DATE / TIME: 201806021309
UNIT TYPE AND RH: 9500
Unit Type and Rh: 9500

## 2017-02-20 LAB — PREPARE FRESH FROZEN PLASMA
UNIT DIVISION: 0
Unit division: 0

## 2017-02-20 LAB — CBC
HCT: 40.4 % (ref 39.0–52.0)
Hemoglobin: 13.6 g/dL (ref 13.0–17.0)
MCH: 31.9 pg (ref 26.0–34.0)
MCHC: 33.7 g/dL (ref 30.0–36.0)
MCV: 94.8 fL (ref 78.0–100.0)
PLATELETS: 295 10*3/uL (ref 150–400)
RBC: 4.26 MIL/uL (ref 4.22–5.81)
RDW: 12.6 % (ref 11.5–15.5)
WBC: 10.1 10*3/uL (ref 4.0–10.5)

## 2017-02-20 LAB — ETHANOL: ALCOHOL ETHYL (B): 16 mg/dL — AB (ref <5)

## 2017-02-20 LAB — PROTIME-INR
INR: 1.06
PROTHROMBIN TIME: 13.8 s (ref 11.4–15.2)

## 2017-02-20 LAB — I-STAT CG4 LACTIC ACID, ED: LACTIC ACID, VENOUS: 5.6 mmol/L — AB (ref 0.5–1.9)

## 2017-02-20 LAB — ABO/RH: ABO/RH(D): B POS

## 2017-02-20 LAB — MRSA PCR SCREENING: MRSA BY PCR: NEGATIVE

## 2017-02-20 SURGERY — ANTERIOR CERVICAL DECOMPRESSION/DISCECTOMY FUSION 3 LEVELS
Anesthesia: General | Site: Neck

## 2017-02-20 MED ORDER — THROMBIN 5000 UNITS EX SOLR
CUTANEOUS | Status: AC
  Filled 2017-02-20: qty 10000

## 2017-02-20 MED ORDER — FLEET ENEMA 7-19 GM/118ML RE ENEM: 1.0000 | ENEMA | Freq: Once | RECTAL | Status: DC | PRN

## 2017-02-20 MED ORDER — FENTANYL CITRATE (PF) 250 MCG/5ML IJ SOLN
INTRAMUSCULAR | Status: AC
  Filled 2017-02-20: qty 5

## 2017-02-20 MED ORDER — CEFAZOLIN SODIUM-DEXTROSE 2-4 GM/100ML-% IV SOLN
2.0000 g | INTRAVENOUS | Status: AC
  Administered 2017-02-20: 2 g via INTRAVENOUS

## 2017-02-20 MED ORDER — PROPOFOL 10 MG/ML IV BOLUS
INTRAVENOUS | Status: DC | PRN
  Administered 2017-02-20: 40 mg via INTRAVENOUS
  Administered 2017-02-20: 200 mg via INTRAVENOUS

## 2017-02-20 MED ORDER — HYDROMORPHONE HCL 1 MG/ML IJ SOLN
1.0000 mg | Freq: Once | INTRAMUSCULAR | Status: AC
  Administered 2017-02-20: 1 mg via INTRAVENOUS
  Filled 2017-02-20: qty 1

## 2017-02-20 MED ORDER — BUPIVACAINE-EPINEPHRINE (PF) 0.5% -1:200000 IJ SOLN
INTRAMUSCULAR | Status: DC | PRN
  Administered 2017-02-20: 10 mL

## 2017-02-20 MED ORDER — CEFAZOLIN SODIUM-DEXTROSE 1-4 GM/50ML-% IV SOLN
1.0000 g | Freq: Three times a day (TID) | INTRAVENOUS | Status: DC
  Administered 2017-02-21 – 2017-02-22 (×5): 1 g via INTRAVENOUS
  Filled 2017-02-20 (×7): qty 50

## 2017-02-20 MED ORDER — DIPHENHYDRAMINE HCL 50 MG/ML IJ SOLN
12.5000 mg | Freq: Four times a day (QID) | INTRAMUSCULAR | Status: DC | PRN
  Administered 2017-02-21: 25 mg via INTRAVENOUS
  Administered 2017-02-21 (×2): 12.5 mg via INTRAVENOUS
  Filled 2017-02-20 (×3): qty 1

## 2017-02-20 MED ORDER — VANCOMYCIN HCL 1000 MG IV SOLR
INTRAVENOUS | Status: AC
  Filled 2017-02-20: qty 1000

## 2017-02-20 MED ORDER — ONDANSETRON HCL 4 MG PO TABS
4.0000 mg | ORAL_TABLET | Freq: Four times a day (QID) | ORAL | Status: DC | PRN
Start: 2017-02-20 — End: 2017-02-24

## 2017-02-20 MED ORDER — BUPIVACAINE LIPOSOME 1.3 % IJ SUSP
INTRAMUSCULAR | Status: DC | PRN
  Administered 2017-02-20: 20 mL

## 2017-02-20 MED ORDER — TETANUS-DIPHTH-ACELL PERTUSSIS 5-2.5-18.5 LF-MCG/0.5 IM SUSP: 0.5000 mL | Freq: Once | INTRAMUSCULAR | Status: DC

## 2017-02-20 MED ORDER — HYDROMORPHONE HCL 1 MG/ML IJ SOLN
INTRAMUSCULAR | Status: AC
  Filled 2017-02-20: qty 1

## 2017-02-20 MED ORDER — 0.9 % SODIUM CHLORIDE (POUR BTL) OPTIME
TOPICAL | Status: DC | PRN
  Administered 2017-02-20: 1000 mL

## 2017-02-20 MED ORDER — BACITRACIN ZINC 500 UNIT/GM EX OINT
TOPICAL_OINTMENT | CUTANEOUS | Status: AC
  Filled 2017-02-20: qty 28.35

## 2017-02-20 MED ORDER — SODIUM CHLORIDE 0.9% FLUSH: 9.0000 mL | INTRAVENOUS | Status: DC | PRN

## 2017-02-20 MED ORDER — THROMBIN 5000 UNITS EX SOLR
CUTANEOUS | Status: DC | PRN
  Administered 2017-02-20 (×2): 5000 [IU] via TOPICAL

## 2017-02-20 MED ORDER — NALOXONE HCL 0.4 MG/ML IJ SOLN: 0.4000 mg | INTRAMUSCULAR | Status: DC | PRN

## 2017-02-20 MED ORDER — MIDAZOLAM HCL 2 MG/2ML IJ SOLN
INTRAMUSCULAR | Status: AC
  Filled 2017-02-20: qty 2

## 2017-02-20 MED ORDER — BISACODYL 5 MG PO TBEC: 5.0000 mg | DELAYED_RELEASE_TABLET | Freq: Every day | ORAL | Status: DC | PRN

## 2017-02-20 MED ORDER — CELECOXIB 200 MG PO CAPS
200.0000 mg | ORAL_CAPSULE | Freq: Two times a day (BID) | ORAL | Status: DC
  Administered 2017-02-21 – 2017-02-24 (×8): 200 mg via ORAL
  Filled 2017-02-20 (×9): qty 1

## 2017-02-20 MED ORDER — PROPOFOL 10 MG/ML IV BOLUS
INTRAVENOUS | Status: AC
  Filled 2017-02-20: qty 20

## 2017-02-20 MED ORDER — ONDANSETRON HCL 4 MG/2ML IJ SOLN
INTRAMUSCULAR | Status: AC
  Administered 2017-02-20: 4 mg
  Filled 2017-02-20: qty 2

## 2017-02-20 MED ORDER — HYDROMORPHONE HCL 1 MG/ML IJ SOLN
1.0000 mg | INTRAMUSCULAR | Status: DC | PRN
  Administered 2017-02-20 – 2017-02-21 (×2): 1 mg via INTRAVENOUS
  Filled 2017-02-20 (×2): qty 1

## 2017-02-20 MED ORDER — HYDROMORPHONE 1 MG/ML IV SOLN
INTRAVENOUS | Status: DC
  Administered 2017-02-20: 25 mg via INTRAVENOUS
  Administered 2017-02-21: 5.7 mg via INTRAVENOUS
  Administered 2017-02-21: 1.5 mg via INTRAVENOUS
  Administered 2017-02-21: 2.7 mg via INTRAVENOUS
  Administered 2017-02-21: 2.1 mg via INTRAVENOUS
  Filled 2017-02-20: qty 25

## 2017-02-20 MED ORDER — LIDOCAINE HCL (CARDIAC) 20 MG/ML IV SOLN
INTRAVENOUS | Status: DC | PRN
  Administered 2017-02-20: 100 mg via INTRATRACHEAL

## 2017-02-20 MED ORDER — MENTHOL 3 MG MT LOZG
1.0000 | LOZENGE | OROMUCOSAL | Status: DC | PRN
  Administered 2017-02-22: 3 mg via ORAL
  Filled 2017-02-20 (×2): qty 9

## 2017-02-20 MED ORDER — LACTATED RINGERS IV SOLN
INTRAVENOUS | Status: DC | PRN
  Administered 2017-02-20: 17:00:00 via INTRAVENOUS

## 2017-02-20 MED ORDER — HYDROMORPHONE HCL 1 MG/ML IJ SOLN
0.2500 mg | INTRAMUSCULAR | Status: DC | PRN
  Administered 2017-02-20: 1 mg via INTRAVENOUS

## 2017-02-20 MED ORDER — HYDROCODONE-ACETAMINOPHEN 7.5-325 MG PO TABS
1.0000 | ORAL_TABLET | ORAL | Status: DC | PRN
  Administered 2017-02-21 – 2017-02-24 (×12): 2 via ORAL
  Filled 2017-02-20 (×6): qty 2
  Filled 2017-02-20: qty 1
  Filled 2017-02-20 (×7): qty 2

## 2017-02-20 MED ORDER — HEMOSTATIC AGENTS (NO CHARGE) OPTIME
TOPICAL | Status: DC | PRN
  Administered 2017-02-20: 1 via TOPICAL

## 2017-02-20 MED ORDER — SODIUM CHLORIDE 0.9 % IJ SOLN
INTRAMUSCULAR | Status: AC
  Filled 2017-02-20: qty 20

## 2017-02-20 MED ORDER — SENNA 8.6 MG PO TABS
1.0000 | ORAL_TABLET | Freq: Two times a day (BID) | ORAL | Status: DC
  Administered 2017-02-20 – 2017-02-24 (×8): 8.6 mg via ORAL
  Filled 2017-02-20 (×8): qty 1

## 2017-02-20 MED ORDER — DOCUSATE SODIUM 100 MG PO CAPS
100.0000 mg | ORAL_CAPSULE | Freq: Two times a day (BID) | ORAL | Status: DC
  Administered 2017-02-20 – 2017-02-24 (×8): 100 mg via ORAL
  Filled 2017-02-20 (×8): qty 1

## 2017-02-20 MED ORDER — SUGAMMADEX SODIUM 200 MG/2ML IV SOLN
INTRAVENOUS | Status: DC | PRN
  Administered 2017-02-20: 200 mg via INTRAVENOUS

## 2017-02-20 MED ORDER — MIDAZOLAM HCL 5 MG/5ML IJ SOLN
INTRAMUSCULAR | Status: DC | PRN
  Administered 2017-02-20: 2 mg via INTRAVENOUS

## 2017-02-20 MED ORDER — FENTANYL CITRATE (PF) 250 MCG/5ML IJ SOLN
INTRAMUSCULAR | Status: DC | PRN
  Administered 2017-02-20 (×2): 100 ug via INTRAVENOUS
  Administered 2017-02-20: 50 ug via INTRAVENOUS
  Administered 2017-02-20: 100 ug via INTRAVENOUS

## 2017-02-20 MED ORDER — SODIUM CHLORIDE 0.9 % IV BOLUS (SEPSIS)
1000.0000 mL | Freq: Once | INTRAVENOUS | Status: AC
  Administered 2017-02-20: 1000 mL via INTRAVENOUS

## 2017-02-20 MED ORDER — SODIUM CHLORIDE 0.9% FLUSH: 3.0000 mL | INTRAVENOUS | Status: DC | PRN

## 2017-02-20 MED ORDER — THROMBIN 5000 UNITS EX SOLR
CUTANEOUS | Status: DC | PRN
  Administered 2017-02-20 (×2): 5 mL via TOPICAL

## 2017-02-20 MED ORDER — PANTOPRAZOLE SODIUM 40 MG IV SOLR
40.0000 mg | Freq: Every day | INTRAVENOUS | Status: DC
  Administered 2017-02-20: 40 mg via INTRAVENOUS
  Filled 2017-02-20: qty 40

## 2017-02-20 MED ORDER — DIPHENHYDRAMINE HCL 50 MG/ML IJ SOLN
INTRAMUSCULAR | Status: DC | PRN
  Administered 2017-02-20: 12.5 mg via INTRAVENOUS

## 2017-02-20 MED ORDER — LIDOCAINE-EPINEPHRINE (PF) 2 %-1:200000 IJ SOLN
INTRAMUSCULAR | Status: AC
  Filled 2017-02-20: qty 20

## 2017-02-20 MED ORDER — ONDANSETRON HCL 4 MG PO TABS: 4.0000 mg | ORAL_TABLET | Freq: Four times a day (QID) | ORAL | Status: DC | PRN

## 2017-02-20 MED ORDER — IOPAMIDOL (ISOVUE-300) INJECTION 61%
INTRAVENOUS | Status: AC
  Administered 2017-02-20: 100 mL
  Filled 2017-02-20: qty 100

## 2017-02-20 MED ORDER — CEFAZOLIN SODIUM-DEXTROSE 2-4 GM/100ML-% IV SOLN
INTRAVENOUS | Status: AC
  Filled 2017-02-20: qty 100

## 2017-02-20 MED ORDER — GABAPENTIN 300 MG PO CAPS
300.0000 mg | ORAL_CAPSULE | Freq: Three times a day (TID) | ORAL | Status: DC
  Administered 2017-02-20 – 2017-02-23 (×8): 300 mg via ORAL
  Filled 2017-02-20 (×8): qty 1

## 2017-02-20 MED ORDER — ZOLPIDEM TARTRATE 5 MG PO TABS
5.0000 mg | ORAL_TABLET | Freq: Every evening | ORAL | Status: DC | PRN
  Administered 2017-02-21 – 2017-02-23 (×4): 5 mg via ORAL
  Filled 2017-02-20 (×4): qty 1

## 2017-02-20 MED ORDER — DIAZEPAM 5 MG PO TABS
5.0000 mg | ORAL_TABLET | Freq: Four times a day (QID) | ORAL | Status: DC | PRN
  Administered 2017-02-21 – 2017-02-24 (×9): 5 mg via ORAL
  Filled 2017-02-20 (×10): qty 1

## 2017-02-20 MED ORDER — POTASSIUM CHLORIDE IN NACL 20-0.9 MEQ/L-% IV SOLN
INTRAVENOUS | Status: DC
  Administered 2017-02-20 – 2017-02-21 (×2): via INTRAVENOUS
  Filled 2017-02-20 (×2): qty 1000

## 2017-02-20 MED ORDER — SODIUM CHLORIDE 0.9 % IR SOLN
Freq: Once | Status: DC
  Filled 2017-02-20: qty 1

## 2017-02-20 MED ORDER — SODIUM CHLORIDE 0.9 % IV SOLN: INTRAVENOUS | Status: DC

## 2017-02-20 MED ORDER — BUPIVACAINE-EPINEPHRINE (PF) 0.5% -1:200000 IJ SOLN
INTRAMUSCULAR | Status: AC
  Filled 2017-02-20: qty 30

## 2017-02-20 MED ORDER — ONDANSETRON HCL 4 MG/2ML IJ SOLN: 4.0000 mg | Freq: Four times a day (QID) | INTRAMUSCULAR | Status: DC | PRN

## 2017-02-20 MED ORDER — MORPHINE SULFATE ER 15 MG PO TBCR
15.0000 mg | EXTENDED_RELEASE_TABLET | Freq: Two times a day (BID) | ORAL | Status: DC
  Administered 2017-02-20 – 2017-02-21 (×2): 15 mg via ORAL
  Filled 2017-02-20 (×2): qty 1

## 2017-02-20 MED ORDER — ONDANSETRON HCL 4 MG/2ML IJ SOLN
INTRAMUSCULAR | Status: DC | PRN
  Administered 2017-02-20: 4 mg via INTRAVENOUS

## 2017-02-20 MED ORDER — HYDROMORPHONE HCL 1 MG/ML IJ SOLN
INTRAMUSCULAR | Status: AC
  Administered 2017-02-20: 1 mg via INTRAVENOUS
  Filled 2017-02-20: qty 1

## 2017-02-20 MED ORDER — SODIUM CHLORIDE 0.9 % IR SOLN
Status: DC | PRN
  Administered 2017-02-20: 500 mL

## 2017-02-20 MED ORDER — SUCCINYLCHOLINE CHLORIDE 20 MG/ML IJ SOLN
INTRAMUSCULAR | Status: DC | PRN
  Administered 2017-02-20: 120 mg via INTRAVENOUS

## 2017-02-20 MED ORDER — BUPIVACAINE LIPOSOME 1.3 % IJ SUSP
20.0000 mL | Freq: Once | INTRAMUSCULAR | Status: DC
  Filled 2017-02-20: qty 20

## 2017-02-20 MED ORDER — SODIUM CHLORIDE 0.9 % IV SOLN: 250.0000 mL | INTRAVENOUS | Status: DC

## 2017-02-20 MED ORDER — ROCURONIUM BROMIDE 100 MG/10ML IV SOLN
INTRAVENOUS | Status: DC | PRN
  Administered 2017-02-20: 50 mg via INTRAVENOUS
  Administered 2017-02-20: 20 mg via INTRAVENOUS
  Administered 2017-02-20: 10 mg via INTRAVENOUS

## 2017-02-20 MED ORDER — IOPAMIDOL (ISOVUE-370) INJECTION 76%
INTRAVENOUS | Status: AC
  Administered 2017-02-20: 50 mL via INTRAVENOUS
  Filled 2017-02-20: qty 50

## 2017-02-20 MED ORDER — ENOXAPARIN SODIUM 40 MG/0.4ML ~~LOC~~ SOLN
40.0000 mg | SUBCUTANEOUS | Status: DC
  Administered 2017-02-21 – 2017-02-23 (×3): 40 mg via SUBCUTANEOUS
  Filled 2017-02-20 (×3): qty 0.4

## 2017-02-20 MED ORDER — LIDOCAINE-EPINEPHRINE (PF) 2 %-1:200000 IJ SOLN
INTRAMUSCULAR | Status: DC | PRN
  Administered 2017-02-20: 10 mL

## 2017-02-20 MED ORDER — SODIUM CHLORIDE BACTERIOSTATIC 0.9 % IJ SOLN
INTRAMUSCULAR | Status: DC | PRN
  Administered 2017-02-20 (×2): 10 mL

## 2017-02-20 MED ORDER — PHENOL 1.4 % MT LIQD
1.0000 | OROMUCOSAL | Status: DC | PRN
Start: 2017-02-20 — End: 2017-02-24

## 2017-02-20 MED ORDER — PROMETHAZINE HCL 25 MG/ML IJ SOLN: 6.2500 mg | INTRAMUSCULAR | Status: DC | PRN

## 2017-02-20 MED ORDER — DIPHENHYDRAMINE HCL 12.5 MG/5ML PO ELIX: 12.5000 mg | ORAL_SOLUTION | Freq: Four times a day (QID) | ORAL | Status: DC | PRN

## 2017-02-20 MED ORDER — ONDANSETRON HCL 4 MG/2ML IJ SOLN
4.0000 mg | Freq: Four times a day (QID) | INTRAMUSCULAR | Status: DC | PRN
  Administered 2017-02-21: 4 mg via INTRAVENOUS
  Filled 2017-02-20: qty 2

## 2017-02-20 MED ORDER — THROMBIN 5000 UNITS EX SOLR
CUTANEOUS | Status: AC
  Filled 2017-02-20: qty 15000

## 2017-02-20 MED ORDER — SODIUM CHLORIDE 0.9% FLUSH
3.0000 mL | Freq: Two times a day (BID) | INTRAVENOUS | Status: DC
  Administered 2017-02-20 – 2017-02-21 (×2): 3 mL via INTRAVENOUS

## 2017-02-20 SURGICAL SUPPLY — 80 items
ADH SKN CLS APL DERMABOND .7 (GAUZE/BANDAGES/DRESSINGS) ×2
BLADE ULTRA TIP 2M (BLADE) IMPLANT
BONE EQUIVA 10CC (Bone Implant) ×2 IMPLANT
BONE EQUIVA 5CC (Bone Implant) ×2 IMPLANT
BUR MATCHSTICK NEURO 3.0 LAGG (BURR) ×3 IMPLANT
CANISTER SUCT 3000ML PPV (MISCELLANEOUS) ×3 IMPLANT
CAP CLSR POST CERV (Cap) ×16 IMPLANT
CARTRIDGE OIL MAESTRO DRILL (MISCELLANEOUS) ×1 IMPLANT
CHLORAPREP W/TINT 26ML (MISCELLANEOUS) ×3 IMPLANT
CONT SPEC 4OZ CLIKSEAL STRL BL (MISCELLANEOUS) ×2 IMPLANT
DECANTER SPIKE VIAL GLASS SM (MISCELLANEOUS) ×3 IMPLANT
DERMABOND ADVANCED (GAUZE/BANDAGES/DRESSINGS) ×4
DERMABOND ADVANCED .7 DNX12 (GAUZE/BANDAGES/DRESSINGS) ×1 IMPLANT
DIFFUSER DRILL AIR PNEUMATIC (MISCELLANEOUS) ×3 IMPLANT
DRAPE C-ARM 42X72 X-RAY (DRAPES) ×6 IMPLANT
DRAPE HALF SHEET 40X57 (DRAPES) IMPLANT
DRAPE LAPAROTOMY 100X72 PEDS (DRAPES) ×3 IMPLANT
DRAPE MICROSCOPE LEICA (MISCELLANEOUS) ×2 IMPLANT
DRAPE POUCH INSTRU U-SHP 10X18 (DRAPES) ×3 IMPLANT
DRAPE SHEET LG 3/4 BI-LAMINATE (DRAPES) ×3 IMPLANT
DRSG OPSITE POSTOP 4X8 (GAUZE/BANDAGES/DRESSINGS) ×2 IMPLANT
ELECT COATED BLADE 2.86 ST (ELECTRODE) ×3 IMPLANT
ELECT REM PT RETURN 9FT ADLT (ELECTROSURGICAL) ×3
ELECTRODE REM PT RTRN 9FT ADLT (ELECTROSURGICAL) ×1 IMPLANT
EVACUATOR 1/8 PVC DRAIN (DRAIN) ×2 IMPLANT
GAUZE SPONGE 4X4 12PLY STRL (GAUZE/BANDAGES/DRESSINGS) IMPLANT
GAUZE SPONGE 4X4 16PLY XRAY LF (GAUZE/BANDAGES/DRESSINGS) IMPLANT
GLOVE BIO SURGEON STRL SZ7 (GLOVE) IMPLANT
GLOVE BIOGEL PI IND STRL 7.0 (GLOVE) IMPLANT
GLOVE BIOGEL PI IND STRL 7.5 (GLOVE) ×1 IMPLANT
GLOVE BIOGEL PI INDICATOR 7.0 (GLOVE)
GLOVE BIOGEL PI INDICATOR 7.5 (GLOVE) ×8
GLOVE EXAM NITRILE LRG STRL (GLOVE) IMPLANT
GLOVE SS BIOGEL STRL SZ 7.5 (GLOVE) ×2 IMPLANT
GLOVE SUPERSENSE BIOGEL SZ 7.5 (GLOVE) ×4
GOWN STRL REUS W/ TWL LRG LVL3 (GOWN DISPOSABLE) ×1 IMPLANT
GOWN STRL REUS W/ TWL XL LVL3 (GOWN DISPOSABLE) ×1 IMPLANT
GOWN STRL REUS W/TWL LRG LVL3 (GOWN DISPOSABLE) ×6
GOWN STRL REUS W/TWL XL LVL3 (GOWN DISPOSABLE)
HEMOSTAT POWDER KIT SURGIFOAM (HEMOSTASIS) ×5 IMPLANT
KIT BASIN OR (CUSTOM PROCEDURE TRAY) ×3 IMPLANT
KIT INFUSE SMALL (Orthopedic Implant) ×2 IMPLANT
KIT ROOM TURNOVER OR (KITS) ×3 IMPLANT
NDL HYPO 21X1.5 SAFETY (NEEDLE) ×1 IMPLANT
NDL SPNL 18GX3.5 QUINCKE PK (NEEDLE) ×1 IMPLANT
NEEDLE HYPO 21X1.5 SAFETY (NEEDLE) ×3 IMPLANT
NEEDLE SPNL 18GX3.5 QUINCKE PK (NEEDLE) ×3 IMPLANT
NS IRRIG 1000ML POUR BTL (IV SOLUTION) ×3 IMPLANT
OIL CARTRIDGE MAESTRO DRILL (MISCELLANEOUS) ×3
PACK LAMINECTOMY NEURO (CUSTOM PROCEDURE TRAY) ×3 IMPLANT
PAD ARMBOARD 7.5X6 YLW CONV (MISCELLANEOUS) ×3 IMPLANT
PATTIES SURGICAL .5 X.5 (GAUZE/BANDAGES/DRESSINGS) ×2 IMPLANT
PATTIES SURGICAL .5X1.5 (GAUZE/BANDAGES/DRESSINGS) ×3 IMPLANT
PIN DISTRACTION 14MM (PIN) ×6 IMPLANT
PUTTY BONE GRAFT KIT 2.5ML (Bone Implant) ×4 IMPLANT
ROD SPINAL 3.5X400MM TI (Rod) ×2 IMPLANT
RUBBERBAND STERILE (MISCELLANEOUS) ×4 IMPLANT
SCREW SPINAL 3.5X22MM POLY (Screw) ×4 IMPLANT
SCREW VIRAGE 3.5X14 (Screw) ×8 IMPLANT
SCREW VIRAGE 3.5X28MM POLY (Screw) ×4 IMPLANT
SEALER BIPOLAR AQUA 2.3 (INSTRUMENTS) ×2 IMPLANT
SPONGE INTESTINAL PEANUT (DISPOSABLE) ×3 IMPLANT
SPONGE SURGIFOAM ABS GEL SZ50 (HEMOSTASIS) ×2 IMPLANT
STAPLER VISISTAT 35W (STAPLE) ×3 IMPLANT
STOCKINETTE 6  STRL (DRAPES) ×2
STOCKINETTE 6 STRL (DRAPES) ×1 IMPLANT
SUT ETHILON 3 0 FSL (SUTURE) ×2 IMPLANT
SUT MNCRL AB 3-0 PS2 18 (SUTURE) ×6 IMPLANT
SUT STRATAFIX 1PDS 45CM VIOLET (SUTURE) ×4 IMPLANT
SUT STRATAFIX MNCRL+ 3-0 PS-2 (SUTURE)
SUT STRATAFIX MONOCRYL 3-0 (SUTURE)
SUT STRATAFIX SPIRAL + 2-0 (SUTURE) ×2 IMPLANT
SUT VIC AB 3-0 SH 8-18 (SUTURE) ×3 IMPLANT
SUTURE STRATFX MNCRL+ 3-0 PS-2 (SUTURE) ×1 IMPLANT
TOWEL GREEN STERILE (TOWEL DISPOSABLE) ×3 IMPLANT
TOWEL GREEN STERILE FF (TOWEL DISPOSABLE) ×6 IMPLANT
TRAY FOLEY CATH SILVER 16FR (SET/KITS/TRAYS/PACK) ×1 IMPLANT
TUBE CONNECTING 12'X1/4 (SUCTIONS) ×1
TUBE CONNECTING 12X1/4 (SUCTIONS) ×2 IMPLANT
WATER STERILE IRR 1000ML POUR (IV SOLUTION) ×3 IMPLANT
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
## 2017-02-20 NOTE — ED Notes (Signed)
Patient transported to CT with RN and nurse tech.

## 2017-02-20 NOTE — Anesthesia Procedure Notes (Signed)
Procedure Name: Intubation Date/Time: 02/20/2017 5:31 PM Performed by: Brien MatesMAHONY, Janette Harvie D Pre-anesthesia Checklist: Patient identified, Emergency Drugs available, Suction available, Patient being monitored and Timeout performed Patient Re-evaluated:Patient Re-evaluated prior to inductionOxygen Delivery Method: Circle system utilized Preoxygenation: Pre-oxygenation with 100% oxygen Intubation Type: IV induction, Rapid sequence and Cricoid Pressure applied Laryngoscope Size: Miller and 2 Grade View: Grade I Tube type: Subglottic suction tube Tube size: 8.0 mm Number of attempts: 1 Airway Equipment and Method: Stylet and Video-laryngoscopy (cervical collar left in situ during induction/laryngoscopy) Placement Confirmation: ETT inserted through vocal cords under direct vision,  positive ETCO2 and breath sounds checked- equal and bilateral Secured at: 24 cm Tube secured with: Tape Dental Injury: Teeth and Oropharynx as per pre-operative assessment
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
## 2017-02-20 NOTE — Progress Notes (Signed)
Prior to patient going to OR he stated he did not want to be listed as a confidential patient. Notified ED registration to remove same.
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
## 2017-02-20 NOTE — Progress Notes (Signed)
Pt states numbness in left hand , feels touch/pressure to fingers , hand and thumb. No numbness in right hand. Moving all extremities well. C-collar remains on. Sleepy but oriented when awakened. Has not voided. Bladder scan 556 ml. Pt given more time to wake up and attempt to urinate prior to introducing in and out or foley.
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
## 2017-02-20 NOTE — Progress Notes (Signed)
Dr. Bevely Palmeritty called and conflicting orders  1. Flat and log roll  2. HOB up and activity with C collar on. Dr. Bevely Palmeritty stated pt does not need to be flat
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
## 2017-02-20 NOTE — Progress Notes (Signed)
100ml of Isovue 300 was extravasated into the lower rt forearm. The IV was removed Ice was applied, the arm was elevated, Dr Benito MccreedyJon Watts examined the patient and orders placed in the chart, and RN Myriam Jacobsonlark Rowe was notified and instructions was given to him.

## 2017-02-20 NOTE — Progress Notes (Signed)
   02/20/17 1500  Clinical Encounter Type  Visited With Patient;Family  Visit Type Follow-up  Referral From Nurse  Consult/Referral To Chaplain  Spiritual Encounters  Spiritual Needs Emotional  Stress Factors  Patient Stress Factors Family relationships;Health changes  Family Stress Factors Lack of knowledge   Contacted Brother Manson Allanerrance Brown [phone]. He is en route. Page on call chaplain when he arrives and will escort to patient bedside. Derek Sullivan

## 2017-02-20 NOTE — Transfer of Care (Signed)
Immediate Anesthesia Transfer of Care Note  Patient: Derek Sullivan  Procedure(s) Performed: Procedure(s): Cervical four-Thoracic one  OPEN REDUCTION CERVICAL FRACTURE AND INTERNAL FIXATION AND FUSION (N/A)  Patient Location: PACU  Anesthesia Type:General  Level of Consciousness: sedated  Airway & Oxygen Therapy: Patient Spontanous Breathing and Patient connected to face mask oxygen  Post-op Assessment: Report given to RN and Post -op Vital signs reviewed and stable  Post vital signs: Reviewed and stable  Last Vitals:  Vitals:   02/20/17 1530 02/20/17 1951  BP: (!) 159/98   Pulse: 92   Resp: 20   Temp:  36.2 C    Last Pain:  Vitals:   02/20/17 1537  TempSrc:   PainSc: 8          Complications: No apparent anesthesia complications

## 2017-02-21 LAB — URINALYSIS, ROUTINE W REFLEX MICROSCOPIC
Bacteria, UA: NONE SEEN
Bilirubin Urine: NEGATIVE
Glucose, UA: NEGATIVE mg/dL
KETONES UR: 20 mg/dL — AB
LEUKOCYTES UA: NEGATIVE
Nitrite: NEGATIVE
PH: 5 (ref 5.0–8.0)
PROTEIN: NEGATIVE mg/dL
Specific Gravity, Urine: 1.039 — ABNORMAL HIGH (ref 1.005–1.030)

## 2017-02-21 LAB — BASIC METABOLIC PANEL
ANION GAP: 8 (ref 5–15)
BUN: 5 mg/dL — AB (ref 6–20)
CHLORIDE: 104 mmol/L (ref 101–111)
CO2: 24 mmol/L (ref 22–32)
Calcium: 9.2 mg/dL (ref 8.9–10.3)
Creatinine, Ser: 0.83 mg/dL (ref 0.61–1.24)
GFR calc Af Amer: >60 mL/min (ref >60)
GLUCOSE: 107 mg/dL — AB (ref 65–99)
POTASSIUM: 4 mmol/L (ref 3.5–5.1)
Sodium: 136 mmol/L (ref 135–145)

## 2017-02-21 LAB — CBC
HEMATOCRIT: 39.1 % (ref 39.0–52.0)
HEMOGLOBIN: 12.9 g/dL — AB (ref 13.0–17.0)
MCH: 31.8 pg (ref 26.0–34.0)
MCHC: 33 g/dL (ref 30.0–36.0)
MCV: 96.3 fL (ref 78.0–100.0)
PLATELETS: 261 10*3/uL (ref 150–400)
RBC: 4.06 MIL/uL — AB (ref 4.22–5.81)
RDW: 12.7 % (ref 11.5–15.5)
WBC: 15.4 10*3/uL — AB (ref 4.0–10.5)

## 2017-02-21 LAB — HIV ANTIBODY (ROUTINE TESTING W REFLEX): HIV Screen 4th Generation wRfx: NONREACTIVE

## 2017-02-21 MED ORDER — ORAL CARE MOUTH RINSE
15.0000 mL | Freq: Two times a day (BID) | OROMUCOSAL | Status: DC
  Administered 2017-02-21 – 2017-02-23 (×4): 15 mL via OROMUCOSAL

## 2017-02-21 MED ORDER — HYDROCODONE-ACETAMINOPHEN 5-325 MG PO TABS: 1.0000 | ORAL_TABLET | ORAL | Status: DC | PRN

## 2017-02-21 NOTE — Anesthesia Postprocedure Evaluation (Signed)
Anesthesia Post Note  Patient: Derek Sullivan  Procedure(s) Performed: Procedure(s) (LRB): Cervical four-Thoracic one  OPEN REDUCTION CERVICAL FRACTURE AND INTERNAL FIXATION AND FUSION (N/A)     Patient location during evaluation: PACU Anesthesia Type: General Level of consciousness: awake and alert Pain management: pain level controlled Vital Signs Assessment: post-procedure vital signs reviewed and stable Respiratory status: spontaneous breathing, nonlabored ventilation, respiratory function stable and patient connected to nasal cannula oxygen Cardiovascular status: blood pressure returned to baseline and stable Postop Assessment: no signs of nausea or vomiting Anesthetic complications: no    Last Vitals:  Vitals:   02/21/17 0500 02/21/17 0600  BP: (!) 161/91 (!) 143/91  Pulse: (!) 103 88  Resp: (!) 21 (!) 23  Temp:      Last Pain:  Vitals:   02/21/17 0400  TempSrc: Axillary  PainSc: Asleep                 Cecile HearingStephen Edward Omari Mcmanaway
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
## 2017-02-21 NOTE — Progress Notes (Signed)
Patient ID: Derek Sullivan, male   DOB: 07/24/1991, 26 y.o.   MRN: [id number] Pt s/p contrast extravasation of about 100 cc into rt forearm region yesterday during CT; on exam today rt arm is edematous but without new skin changes(blisters, erythema); intact distal pulses; rec elevation of arm with cool compresses to forearm periodically; monitor closely for worsening edema. CCS aware.

## 2017-02-21 NOTE — Progress Notes (Signed)
Patient arm is swollen, no new skin changes, patient arm resting on blankets.  Patient continues to complain about right elbow pain.  Patient also seen by Jeananne RamaKevin Allred Radiology PA, and trauma service.
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
## 2017-02-21 NOTE — Progress Notes (Signed)
Report called to 5N, patient going to room 11. Patient transported by RN, parents are at bedside.

## 2017-02-21 NOTE — Progress Notes (Signed)
No acute events Moving arms and legs with full strength Collar fits well D/c drain Ok to transfer to floor Home today or tomorrow

## 2017-02-21 NOTE — Progress Notes (Signed)
Drain removed per order. Site clean/dry/intact. Gauze dressing in place with honeycomb dressing. No questions or concerns at this time.   PCA discontinued per order.

## 2017-02-22 ENCOUNTER — Encounter (HOSPITAL_COMMUNITY): Payer: Self-pay | Admitting: Neurological Surgery

## 2017-02-22 MED ORDER — MENTHOL 3 MG MT LOZG: 1.0000 | LOZENGE | OROMUCOSAL | Status: DC | PRN

## 2017-02-22 MED ORDER — TRAMADOL HCL 50 MG PO TABS: 50.0000 mg | ORAL_TABLET | Freq: Four times a day (QID) | ORAL | Status: DC

## 2017-02-22 MED ORDER — METHOCARBAMOL 500 MG PO TABS
500.0000 mg | ORAL_TABLET | Freq: Three times a day (TID) | ORAL | Status: DC
  Administered 2017-02-22 – 2017-02-24 (×7): 500 mg via ORAL
  Filled 2017-02-22 (×7): qty 1

## 2017-02-22 MED ORDER — DIPHENHYDRAMINE HCL 25 MG PO CAPS
25.0000 mg | ORAL_CAPSULE | Freq: Four times a day (QID) | ORAL | Status: DC | PRN
  Administered 2017-02-23: 25 mg via ORAL
  Filled 2017-02-22: qty 1

## 2017-02-22 MED ORDER — MORPHINE SULFATE (PF) 4 MG/ML IV SOLN
2.0000 mg | INTRAVENOUS | Status: DC | PRN
  Administered 2017-02-22 – 2017-02-24 (×11): 2 mg via INTRAVENOUS
  Filled 2017-02-22 (×11): qty 1

## 2017-02-22 MED FILL — Gelatin Absorbable MT Powder: OROMUCOSAL | Qty: 1 | Status: AC

## 2017-02-22 MED FILL — Thrombin For Soln 5000 Unit: CUTANEOUS | Qty: 5000 | Status: AC
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
## 2017-02-22 NOTE — Progress Notes (Signed)
No acute events Complains of neck pain as expected Moving arms and legs with full strength Wound looks good Ok for d/c from my perspective Follow up in three weeks Keep Aspen collar Feel free to call with questions
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
## 2017-02-23 ENCOUNTER — Encounter (HOSPITAL_COMMUNITY): Payer: Self-pay | Admitting: General Practice

## 2017-02-23 LAB — CBC
HCT: 38.5 % — ABNORMAL LOW (ref 39.0–52.0)
HEMOGLOBIN: 12.9 g/dL — AB (ref 13.0–17.0)
MCH: 31.9 pg (ref 26.0–34.0)
MCHC: 33.5 g/dL (ref 30.0–36.0)
MCV: 95.3 fL (ref 78.0–100.0)
Platelets: 268 10*3/uL (ref 150–400)
RBC: 4.04 MIL/uL — ABNORMAL LOW (ref 4.22–5.81)
RDW: 12.2 % (ref 11.5–15.5)
WBC: 18.1 10*3/uL — ABNORMAL HIGH (ref 4.0–10.5)

## 2017-02-23 LAB — CDS SEROLOGY

## 2017-02-23 MED ORDER — GABAPENTIN 300 MG PO CAPS: 600.0000 mg | ORAL_CAPSULE | Freq: Three times a day (TID) | ORAL | Status: DC

## 2017-02-23 MED ORDER — GABAPENTIN 400 MG PO CAPS
400.0000 mg | ORAL_CAPSULE | Freq: Three times a day (TID) | ORAL | Status: DC
  Administered 2017-02-23 – 2017-02-24 (×3): 400 mg via ORAL
  Filled 2017-02-23 (×3): qty 1
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
## 2017-02-23 NOTE — Progress Notes (Signed)
Requested RW to be delivered to room today for discharge.
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
## 2017-02-23 NOTE — Progress Notes (Signed)
Patient ID: Roger ShelterLaShawn Dougal, male   DOB: 11/06/1990, 26 y.o.   MRN: [id number] Will increase gabapentin for nerve pain. Anticipate D/C later today. See other notes. Violeta GelinasBurke Camey Edell, MD, MPH, FACS Trauma: [phone] General Surgery: [phone]
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
## 2017-02-24 LAB — CBC
HEMATOCRIT: 41 % (ref 39.0–52.0)
HEMOGLOBIN: 14.1 g/dL (ref 13.0–17.0)
MCH: 32.9 pg (ref 26.0–34.0)
MCHC: 34.4 g/dL (ref 30.0–36.0)
MCV: 95.6 fL (ref 78.0–100.0)
PLATELETS: 325 10*3/uL (ref 150–400)
RBC: 4.29 MIL/uL (ref 4.22–5.81)
RDW: 12.1 % (ref 11.5–15.5)
WBC: 14.7 10*3/uL — ABNORMAL HIGH (ref 4.0–10.5)

## 2017-02-24 MED ORDER — CELECOXIB 200 MG PO CAPS: 200.0000 mg | ORAL_CAPSULE | Freq: Two times a day (BID) | ORAL | 0 refills | Status: DC

## 2017-02-24 MED ORDER — HYDROCODONE-ACETAMINOPHEN 7.5-325 MG PO TABS
1.0000 | ORAL_TABLET | ORAL | 0 refills | Status: DC | PRN
Start: 2017-02-24 — End: 2017-02-28

## 2017-02-24 MED ORDER — GABAPENTIN 400 MG PO CAPS: 400.0000 mg | ORAL_CAPSULE | Freq: Three times a day (TID) | ORAL | 0 refills | Status: DC

## 2017-02-24 MED ORDER — METHOCARBAMOL 500 MG PO TABS: 500.0000 mg | ORAL_TABLET | Freq: Three times a day (TID) | ORAL | 0 refills | Status: DC | PRN

## 2017-02-24 NOTE — Progress Notes (Signed)
Patient upset this am after I gave him Vicodin and  Valium.  He said he was supposed to get Morphine because the Vicodin doesn't work and that he should get Morphine until "they find something that does work." He also said that he wanted the "stronger" muscle relaxer not the Valium. I explained to him that the Robaxin was scheduled. He would get it at 10am,but Valium was a muscle relaxer as well and he could have it now. Also explained to him that the Morphine was ordered for severe breakthrough pain after taking the Vicodin.   I told him he could have the Morphine to an hour after he took the valium and vicodin.  He said he just wanted the medicine given like it was given downstairs([facility])? He said Vicodin was supposed to be given for breakthrough pain.  I offered to call and get another oral pain med ordered.  I asked him if he had ever taken Oxycodone or Percocet. He said he could not take those meds.  I did attempt to wake him up at 0522 to give him pain meds but he continued snoring even after I scanned the meds, called his name and put the straw to his lips. He appeared too sedated at that time to take his medicine. Pt up walking in the hall at this time.  Wants to go back to [facility].
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
## 2017-02-24 NOTE — Discharge Instructions (Signed)
Wear the neck brace at all times except to shower. Keep the incision covered with a clean dry dressing daily.   1. PAIN CONTROL:  1. Pain is best controlled by a usual combination of three different methods TOGETHER:  1. Ice/Heat 2. Over the counter pain medication 3. Prescription pain medication 2. Most patients will experience some swelling and bruising around wounds. Ice packs or heating pads (30-60 minutes up to 6 times a day) will help. Use ice for the first few days to help decrease swelling and bruising, then switch to heat to help relax tight/sore spots and speed recovery. Some people prefer to use ice alone, heat alone, alternating between ice & heat. Experiment to what works for you. Swelling and bruising can take several weeks to resolve.  3. It is helpful to take an over-the-counter pain medication regularly for the first few weeks. Choose one of the following that works best for you:  1. Naproxen (Aleve, etc) Two 220mg  tabs twice a day 2. Ibuprofen (Advil, etc) Three 200mg  tabs four times a day (every meal & bedtime) 3. Acetaminophen (Tylenol, etc) 500-650mg  four times a day (every meal & bedtime) 4. A prescription for pain medication (such as oxycodone, hydrocodone, etc) should be given to you upon discharge. Take your pain medication as prescribed.  1. If you are having problems/concerns with the prescription medicine (does not control pain, nausea, vomiting, rash, itching, etc), please call us [phone] to see if we need to switch you to a different pain medicine that will work better for you and/or control your side effect better. 2. If you need a refill on your pain medication, please contact your pharmacy. They will contact our office to request authorization. Prescriptions will not be filled after 5 pm or on week-ends. 4. Avoid getting constipated. When taking pain medications, it is common to experience some constipation. Increasing fluid intake and taking a fiber supplement  (such as Metamucil, Citrucel, FiberCon, MiraLax, etc) 1-2 times a day regularly will usually help prevent this problem from occurring. A mild laxative (prune juice, Milk of Magnesia, MiraLax, etc) should be taken according to package directions if there are no bowel movements after 48 hours.  5. Watch out for diarrhea. If you have many loose bowel movements, simplify your diet to bland foods & liquids for a few days. Stop any stool softeners and decrease your fiber supplement. Switching to mild anti-diarrheal medications (Kayopectate, Pepto Bismol) can help. If this worsens or does not improve, please call us. 6. Wash / shower every day. You may shower daily and replace your bandges after showering. No bathing or submerging your wounds in water until they heal. Can remove c collar only to shower then quickly replace. 7. FOLLOW UP in our office  1. Please call [facility] at [phone] with any questions or concerns.   WHEN TO CALL US [phone]:  1. Poor pain control 2. Reactions / problems with new medications (rash/itching, nausea, etc)  3. Fever over 101.5 F (38.5 C) 4. Worsening swelling or bruising 5. Continued bleeding from wounds. 6. Increased pain, redness, or drainage from the wounds which could be signs of infection  The clinic staff is available to answer your questions during regular business hours (8:30am-5pm). Please dont hesitate to call and ask to speak to one of our nurses for clinical concerns.  If you have a medical emergency, go to the nearest emergency room or call 911.  A surgeon from [facility] is always on  call at the [facility], [place]  [address], [place], [place] [address] ?  MAIN: [phone] ? TOLL FREE: [phone] ?  FAX [phone]  [email or web address]

## 2017-02-24 NOTE — Progress Notes (Signed)
Asked to take a look at surgical site Surgical site: C/D/I. No redness, warmth. Minimal TTP. No fluctuance. No drainage. No sign of infection Cleared for discharge F/U 3 weeks outpt
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
## 2017-02-25 ENCOUNTER — Encounter (HOSPITAL_COMMUNITY): Payer: Self-pay

## 2017-02-28 ENCOUNTER — Telehealth: Payer: Self-pay | Admitting: General Surgery

## 2017-02-28 ENCOUNTER — Encounter (HOSPITAL_COMMUNITY): Payer: Self-pay | Admitting: *Deleted

## 2017-02-28 ENCOUNTER — Ambulatory Visit (HOSPITAL_COMMUNITY)
Admission: EM | Admit: 2017-02-28 | Discharge: 2017-02-28 | Disposition: A | Discharge status: Home / Self Care | Payer: BLUE CROSS/BLUE SHIELD | Attending: Family Medicine | Admitting: Family Medicine

## 2017-02-28 DIAGNOSIS — S12591A Other nondisplaced fracture of sixth cervical vertebra, initial encounter for closed fracture: Secondary | ICD-10-CM | POA: Diagnosis not present

## 2017-02-28 DIAGNOSIS — M542 Cervicalgia: Secondary | ICD-10-CM | POA: Diagnosis not present

## 2017-02-28 MED ORDER — HYDROCODONE-ACETAMINOPHEN 7.5-325 MG PO TABS: 1.0000 | ORAL_TABLET | ORAL | 0 refills | Status: AC | PRN

## 2017-02-28 NOTE — Telephone Encounter (Signed)
He called requesting pain medication for his nerve pain.  I informed him that we did not call in pain medication on weekends or at night.  If he was having severe pain, I advised him to go to Urgent Care or the ED.

## 2017-02-28 NOTE — Discharge Instructions (Signed)
It was nice seeing you today. I am sorry about your neck injury. I have given you few tablet of your Oxy. Please call your surgeon's office tomorrow and follow-up with neurologist as planned.
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
## 2017-05-20 ENCOUNTER — Emergency Department (HOSPITAL_COMMUNITY)
Admission: EM | Admit: 2017-05-20 | Discharge: 2017-05-20 | Disposition: A | Discharge status: Other Acute Inpatient Hospital | Payer: BLUE CROSS/BLUE SHIELD | Attending: Emergency Medicine | Admitting: Emergency Medicine

## 2017-05-20 ENCOUNTER — Encounter (HOSPITAL_COMMUNITY): Payer: Self-pay | Admitting: Emergency Medicine

## 2017-05-20 ENCOUNTER — Encounter (HOSPITAL_COMMUNITY): Payer: Self-pay | Admitting: *Deleted

## 2017-05-20 ENCOUNTER — Inpatient Hospital Stay (HOSPITAL_COMMUNITY)
Admission: AD | Admit: 2017-05-20 | Discharge: 2017-05-24 | DRG: 885 | Disposition: A | Discharge status: Home / Self Care | Payer: Federal, State, Local not specified - Other | Source: Intra-hospital | Attending: Psychiatry | Admitting: Psychiatry

## 2017-05-20 DIAGNOSIS — F332 Major depressive disorder, recurrent severe without psychotic features: Secondary | ICD-10-CM | POA: Insufficient documentation

## 2017-05-20 DIAGNOSIS — R45850 Homicidal ideations: Secondary | ICD-10-CM

## 2017-05-20 DIAGNOSIS — X810XXA Intentional self-harm by jumping or lying in front of motor vehicle, initial encounter: Secondary | ICD-10-CM | POA: Diagnosis not present

## 2017-05-20 DIAGNOSIS — I10 Essential (primary) hypertension: Secondary | ICD-10-CM | POA: Diagnosis present

## 2017-05-20 DIAGNOSIS — R4584 Anhedonia: Secondary | ICD-10-CM | POA: Diagnosis not present

## 2017-05-20 DIAGNOSIS — Z630 Problems in relationship with spouse or partner: Secondary | ICD-10-CM | POA: Diagnosis not present

## 2017-05-20 DIAGNOSIS — T1491XA Suicide attempt, initial encounter: Secondary | ICD-10-CM | POA: Diagnosis not present

## 2017-05-20 DIAGNOSIS — Z885 Allergy status to narcotic agent status: Secondary | ICD-10-CM

## 2017-05-20 DIAGNOSIS — F17210 Nicotine dependence, cigarettes, uncomplicated: Secondary | ICD-10-CM | POA: Diagnosis not present

## 2017-05-20 DIAGNOSIS — F339 Major depressive disorder, recurrent, unspecified: Secondary | ICD-10-CM | POA: Diagnosis not present

## 2017-05-20 DIAGNOSIS — Z79899 Other long term (current) drug therapy: Secondary | ICD-10-CM

## 2017-05-20 DIAGNOSIS — G4700 Insomnia, unspecified: Secondary | ICD-10-CM | POA: Diagnosis present

## 2017-05-20 DIAGNOSIS — R45851 Suicidal ideations: Secondary | ICD-10-CM | POA: Insufficient documentation

## 2017-05-20 DIAGNOSIS — Z915 Personal history of self-harm: Secondary | ICD-10-CM

## 2017-05-20 DIAGNOSIS — F419 Anxiety disorder, unspecified: Secondary | ICD-10-CM | POA: Diagnosis present

## 2017-05-20 DIAGNOSIS — Z23 Encounter for immunization: Secondary | ICD-10-CM | POA: Diagnosis not present

## 2017-05-20 DIAGNOSIS — F1290 Cannabis use, unspecified, uncomplicated: Secondary | ICD-10-CM | POA: Diagnosis not present

## 2017-05-20 DIAGNOSIS — Z791 Long term (current) use of non-steroidal anti-inflammatories (NSAID): Secondary | ICD-10-CM

## 2017-05-20 DIAGNOSIS — M542 Cervicalgia: Secondary | ICD-10-CM | POA: Diagnosis not present

## 2017-05-20 LAB — COMPREHENSIVE METABOLIC PANEL
ALBUMIN: 4.3 g/dL (ref 3.5–5.0)
ALK PHOS: 66 U/L (ref 38–126)
ALT: 14 U/L — AB (ref 17–63)
AST: 17 U/L (ref 15–41)
Anion gap: 10 (ref 5–15)
BILIRUBIN TOTAL: 0.6 mg/dL (ref 0.3–1.2)
BUN: 6 mg/dL (ref 6–20)
CALCIUM: 9.6 mg/dL (ref 8.9–10.3)
CO2: 24 mmol/L (ref 22–32)
CREATININE: 0.83 mg/dL (ref 0.61–1.24)
Chloride: 108 mmol/L (ref 101–111)
GFR calc Af Amer: >60 mL/min (ref >60)
GLUCOSE: 94 mg/dL (ref 65–99)
Potassium: 3.4 mmol/L — ABNORMAL LOW (ref 3.5–5.1)
Sodium: 142 mmol/L (ref 135–145)
TOTAL PROTEIN: 7.2 g/dL (ref 6.5–8.1)

## 2017-05-20 LAB — RAPID URINE DRUG SCREEN, HOSP PERFORMED
Amphetamines: NOT DETECTED
BARBITURATES: NOT DETECTED
BENZODIAZEPINES: NOT DETECTED
Cocaine: NOT DETECTED
Opiates: NOT DETECTED
Tetrahydrocannabinol: POSITIVE — AB

## 2017-05-20 LAB — CBC
HEMATOCRIT: 40.2 % (ref 39.0–52.0)
Hemoglobin: 13.4 g/dL (ref 13.0–17.0)
MCH: 31.2 pg (ref 26.0–34.0)
MCHC: 33.3 g/dL (ref 30.0–36.0)
MCV: 93.5 fL (ref 78.0–100.0)
PLATELETS: 309 10*3/uL (ref 150–400)
RBC: 4.3 MIL/uL (ref 4.22–5.81)
RDW: 12.7 % (ref 11.5–15.5)
WBC: 9.4 10*3/uL (ref 4.0–10.5)

## 2017-05-20 LAB — ETHANOL

## 2017-05-20 LAB — SALICYLATE LEVEL: Salicylate Lvl: <7 mg/dL (ref 2.8–30.0)

## 2017-05-20 LAB — ACETAMINOPHEN LEVEL: Acetaminophen (Tylenol), Serum: <10 ug/mL — ABNORMAL LOW (ref 10–30)

## 2017-05-20 MED ORDER — PNEUMOCOCCAL VAC POLYVALENT 25 MCG/0.5ML IJ INJ
0.5000 mL | INJECTION | INTRAMUSCULAR | Status: AC
  Administered 2017-05-21: 0.5 mL via INTRAMUSCULAR

## 2017-05-20 MED ORDER — ACETAMINOPHEN 325 MG PO TABS: 650.0000 mg | ORAL_TABLET | Freq: Four times a day (QID) | ORAL | Status: DC | PRN

## 2017-05-20 MED ORDER — MAGNESIUM HYDROXIDE 400 MG/5ML PO SUSP: 30.0000 mL | Freq: Every day | ORAL | Status: DC | PRN

## 2017-05-20 MED ORDER — HYDROXYZINE HCL 25 MG PO TABS
25.0000 mg | ORAL_TABLET | Freq: Three times a day (TID) | ORAL | Status: DC | PRN
Start: 2017-05-20 — End: 2017-05-24
  Administered 2017-05-21 – 2017-05-22 (×3): 25 mg via ORAL
  Filled 2017-05-20 (×3): qty 1

## 2017-05-20 MED ORDER — ALUM & MAG HYDROXIDE-SIMETH 200-200-20 MG/5ML PO SUSP: 30.0000 mL | ORAL | Status: DC | PRN

## 2017-05-20 MED ORDER — TRAZODONE HCL 50 MG PO TABS
50.0000 mg | ORAL_TABLET | Freq: Every evening | ORAL | Status: DC | PRN
Start: 2017-05-20 — End: 2017-05-24
  Administered 2017-05-21 – 2017-05-23 (×2): 50 mg via ORAL
  Filled 2017-05-20 (×3): qty 1
  Filled 2017-05-20: qty 7

## 2017-05-20 MED ORDER — ENSURE ENLIVE PO LIQD
237.0000 mL | Freq: Two times a day (BID) | ORAL | Status: DC
  Administered 2017-05-21 – 2017-05-23 (×5): 237 mL via ORAL

## 2017-05-20 NOTE — Progress Notes (Signed)
[facility] ; Accepting provider is Derek ReusJustina Okonkwo, Derek Sullivan; Attending provider is Derek Sullivan.  Number for report is [phone].   Derek Sullivan, Derek Sullivan, she has notified the patient's nurse, Derek MiyamotoBonnie Shanas,Derek Sullivan.   Derek Sullivan MSW, LCSWA CSW Disposition [phone]
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
## 2017-05-20 NOTE — ED Notes (Signed)
Security at bedside to wand, staffing notified of sitter need.
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
## 2017-05-20 NOTE — BH Assessment (Signed)
[facility]. MD [place], room 401-1. Please call report to [phone].

## 2017-05-20 NOTE — Progress Notes (Signed)
D: Pt passive SI-contracts for safety, pt has minimal interaction with peers, pt seems to keep to himself even when he is in the dayroom. Pt is pleasant and cooperative. Pt presents depressed, but will engage when talked to, pt stated he likes to keep to himself.   A: Pt was offered support and encouragement. Pt was given scheduled medications. Pt was encourage to attend groups. Q 15 minute checks were done for safety.   R: safety maintained on unit.
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
## 2017-05-20 NOTE — ED Notes (Addendum)
Telepsych eval at this time.  
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
## 2017-05-21 DIAGNOSIS — F17210 Nicotine dependence, cigarettes, uncomplicated: Secondary | ICD-10-CM

## 2017-05-21 DIAGNOSIS — X810XXA Intentional self-harm by jumping or lying in front of motor vehicle, initial encounter: Secondary | ICD-10-CM

## 2017-05-21 DIAGNOSIS — F339 Major depressive disorder, recurrent, unspecified: Secondary | ICD-10-CM

## 2017-05-21 DIAGNOSIS — Z560 Unemployment, unspecified: Secondary | ICD-10-CM

## 2017-05-21 DIAGNOSIS — T1491XA Suicide attempt, initial encounter: Secondary | ICD-10-CM

## 2017-05-21 MED ORDER — DULOXETINE HCL 30 MG PO CPEP
30.0000 mg | ORAL_CAPSULE | Freq: Every day | ORAL | Status: DC
  Administered 2017-05-22 – 2017-05-24 (×3): 30 mg via ORAL
  Filled 2017-05-21 (×4): qty 1

## 2017-05-21 MED ORDER — AMLODIPINE BESYLATE 5 MG PO TABS
5.0000 mg | ORAL_TABLET | Freq: Every day | ORAL | Status: DC
  Administered 2017-05-21 – 2017-05-24 (×4): 5 mg via ORAL
  Filled 2017-05-21 (×5): qty 1

## 2017-05-21 MED ORDER — GABAPENTIN 100 MG PO CAPS
100.0000 mg | ORAL_CAPSULE | Freq: Three times a day (TID) | ORAL | Status: DC
  Administered 2017-05-21 – 2017-05-24 (×8): 100 mg via ORAL
  Filled 2017-05-21 (×11): qty 1

## 2017-05-21 NOTE — Progress Notes (Signed)
Recreation Therapy Notes  Date: 05/21/17 Time: 0930 Location: [address] Dayroom  Group Topic: Stress Management  Goal Area(s) Addresses:  Patient will verbalize importance of using healthy stress management.  Patient will identify positive emotions associated with healthy stress management.   Intervention: Stress Management  Activity :  Progressive Muscle Relaxation.  LRT introduced the stress management technique of progressive muscle relaxation.  Patients were to follow along as LRT read script to fully engage in the technique.  Education:  Stress Management, Discharge Planning.   Education Outcome: Acknowledges edcuation/In group clarification offered/Needs additional education  Clinical Observations/Feedback: Pt did not attend group.    Caroll RancherMarjette Fumi Guadron, LRT/CTRS         Caroll RancherLindsay, Bernerd Terhune A 05/21/2017 12:14 PM

## 2017-05-21 NOTE — [facility] Group Notes (Signed)
LCSW Group Therapy Note  05/21/2017 1:15pm  Type of Therapy and Topic:  Group Therapy:  Feelings around Relapse and Recovery  Participation Level:  Active   Description of Group:    Patients in this group will discuss emotions they experience before and after a relapse. They will process how experiencing these feelings, or avoidance of experiencing them, relates to having a relapse. Facilitator will guide patients to explore emotions they have related to recovery. Patients will be encouraged to process which emotions are more powerful. They will be guided to discuss the emotional reaction significant others in their lives may have to their relapse or recovery. Patients will be assisted in exploring ways to respond to the emotions of others without this contributing to a relapse.  Therapeutic Goals: 1. Patient will identify two or more emotions that lead to a relapse for them 2. Patient will identify two emotions that result when they relapse 3. Patient will identify two emotions related to recovery 4. Patient will demonstrate ability to communicate their needs through discussion and/or role plays   Summary of Patient Progress: Pt identified stubbornness and being "hard headed" as behaviors he would like to change. He reports that his causes difficulty in relationships. He identified feeling disrespected as a trigger for his anger.     Therapeutic Modalities:   Cognitive Behavioral Therapy Solution-Focused Therapy Assertiveness Training Relapse Prevention Therapy   Verdene LennertLauren C Bellatrix Devonshire, LCSW 05/21/2017 2:06 PM
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
## 2017-05-21 NOTE — Progress Notes (Signed)
NUTRITION ASSESSMENT  Pt identified as at risk on the Malnutrition Screen Tool  INTERVENTION: 1. Educated patient on the importance of nutrition and encouraged intake of food and beverages. 2. Discussed weight goals. 3. Supplements: continue Ensure Enlive BID, each supplement provides 350 kcal and 20 grams of protein  NUTRITION DIAGNOSIS: Unintentional weight loss related to sub-optimal intake as evidenced by pt report.   Goal: Pt to meet >/= 90% of their estimated nutrition needs.  Monitor:  PO intake  Assessment:  Pt admitted for SI with attempt by walking in front of a car. He has been feeling depressed with passive SI d/t issues with individuals close to him. Per review, he has lost 14 lbs (5.6% body weight) in the past 2 months which is not significant for time frame. Continue Ensure Enlive at this time but encourage PO intakes of meals and snacks.    26 y.o. male  Height: Ht Readings from Last 1 Encounters:  05/20/17 6\' 5"  (1.956 m)    Weight: Wt Readings from Last 1 Encounters:  05/20/17 236 lb (107 kg)    Weight Hx: Wt Readings from Last 10 Encounters:  05/20/17 236 lb (107 kg)  02/20/17 250 lb 10.6 oz (113.7 kg)  04/14/16 247 lb (112 kg)  03/26/16 260 lb (117.9 kg)  04/24/15 281 lb 15.5 oz (127.9 kg)  10/26/14 283 lb (128.4 kg)  03/16/14 (!) 319 lb (144.7 kg)  12/13/13 (!) 320 lb (145.2 kg)  07/01/13 275 lb (124.7 kg)    BMI:  Body mass index is 27.99 kg/m. Pt meets criteria for overweight based on current BMI.  Estimated Nutritional Needs: Kcal: 25-30 kcal/kg Protein: > 1 gram protein/kg Fluid: 1 ml/kcal  Diet Order: Diet regular Room service appropriate? Yes; Fluid consistency: Thin Pt is also offered choice of unit snacks mid-morning and mid-afternoon.  Pt is eating as desired.   Lab results and medications reviewed.     Trenton GammonJessica Helena Sardo, MS, RD, LDN, [facility] Inpatient Clinical Dietitian Pager # [phone] After hours/weekend pager #  [phone]

## 2017-05-21 NOTE — Progress Notes (Signed)
Nursing Note 05/21/2017 [id number]  Data Reports sleeping poor without PRN sleep med.  Rates depression 10/10, hopelessness 10/10, and anxiety 10/10. Affect depressed, anxious this AM.  Denies HI,AVH.  Endorses SI but agrees to come to staff before acting on any harmful thoughts.  Patient reports feeling frustrated this morning after a phone call with girlfriend "I told her she needs to fix herself too, and she said she felt like I was blaming her."  Patient spoke with SW after speaking with RN and stated he felt much better after talking to SW.   Action Spoke with patient 1:1, nurse offered support to patient throughout shift.  Continues to be monitored on 15 minute checks for safety.  Response Remains safe and appropriate on unit. Reports improvement in mood throughout day.
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
## 2017-05-22 DIAGNOSIS — G4700 Insomnia, unspecified: Secondary | ICD-10-CM

## 2017-05-22 DIAGNOSIS — R450 Nervousness: Secondary | ICD-10-CM

## 2017-05-22 DIAGNOSIS — F419 Anxiety disorder, unspecified: Secondary | ICD-10-CM

## 2017-05-22 DIAGNOSIS — F1290 Cannabis use, unspecified, uncomplicated: Secondary | ICD-10-CM

## 2017-05-22 DIAGNOSIS — F332 Major depressive disorder, recurrent severe without psychotic features: Principal | ICD-10-CM

## 2017-05-22 DIAGNOSIS — F39 Unspecified mood [affective] disorder: Secondary | ICD-10-CM

## 2017-05-22 DIAGNOSIS — M542 Cervicalgia: Secondary | ICD-10-CM

## 2017-05-22 NOTE — Plan of Care (Signed)
Problem: Medication: Goal: Compliance with prescribed medication regimen will improve Outcome: Progressing Pt taking his medications, pt asked for his PRN sleep medications this evening.

## 2017-05-22 NOTE — [facility] Group Notes (Signed)
[facility] LCSW Group Therapy Note  05/22/2017  10:15 to 11:15AM  Type of Therapy and Topic:  Group Therapy: Avoiding Self-Sabotaging and Enabling Behaviors  Participation Level:  Minimal   Description of Group The main focus of today's process group to discuss what "self-sabotage" means and use motivational interviewing to discuss what benefits, negative or positive, were involved in a self-identified self-sabotaging behavior. We then talked about reasons the patient may want to change the behavior and their current desire to change.   Summary of Patient Progress: Patient appeared engaged as evidenced by comments and eye contacted and was active until CSW provided redirection in order to avoid monopolizing. Patient appeared irate and left group. Patient identified early on his plan was to become less reactive to stressers thus more able to respond.   Therapeutic molalities: Cognitive Behavioral Therapy Person-Centered Therapy Motivational Interviewing  Therapeutic Goals: 1. Patients will demonstrate understanding of the concept of self sabotage 2. Patients will be able to identify pros and cons of their behaviors 3. Patients will be able to identify at least two motivating factors for l of their desire for change   Derek [name], LCSW

## 2017-05-22 NOTE — Progress Notes (Signed)
Patient has been up in the dayroom playing cards with another patient. Patient currently denies having pain, -si/hi/a/v hall. He reports that he is ready for discharge and has signed a 72 hour.  Support and encouragement offered, safety maintained on unit, will continue to monitor.

## 2017-05-22 NOTE — Progress Notes (Signed)
D: Pt denies SI/HI/AVH. Pt is pleasant and cooperative. Pt continues to struggle with his situations, pt appears to have insight into his Tx , but pt seems to have difficulty with his girlfriend when it comes to their mental health issues.   A: Pt was offered support and encouragement. Pt was given scheduled medications. Pt was encourage to attend groups. Q 15 minute checks were done for safety.   R:Pt attends groups and interacts well with peers and staff. Pt is taking medication. Pt has no complaints.Pt receptive to treatment and safety maintained on unit.
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
## 2017-05-22 NOTE — Progress Notes (Signed)
[facility] Group Notes:  (Nursing/MHT/Case Management/Adjunct)  Date:  05/22/2017  Time:  11:10 PM  Type of Therapy:  Psychoeducational Skills  Participation Level:  Active  Participation Quality:  Appropriate  Affect:  Appropriate  Cognitive:  Appropriate  Insight:  Appropriate  Engagement in Group:  Engaged  Modes of Intervention:  Education  Summary of Progress/Problems: The patient stated that he had a great day overall and that he talked more and smiled more frequently. He also states that he had a good visit with his mother. As for the theme of the day, his coping skill will be to try to listen more to his mother'Sullivan side of the story when talking to her.   Derek Sullivan 05/22/2017, 11:10 PM

## 2017-05-22 NOTE — [facility] Group Notes (Addendum)
   Date:  05/22/2017  Time:  1300  Type of Therapy:  Nurse Education  /  The group focuses on teaching patients how to identify their unhealthy behaviors and replace them with healtheir ones.  Participation Level:  Did Not Attend  Participation Quality:  good  Affect:  bright  Cognitive:  intact  Insight:  good  Engagement in Group:  Very   Modes of Intervention:  discussion  Summary of Progress/Problems:  Rich BraveDuke, Amrita Radu Lynn 05/22/2017, 2:37 PM

## 2017-05-23 NOTE — [facility] Suicide Risk Assessment (Signed)
[facility] INPATIENT:  Family/Significant Other Suicide Prevention Education  Suicide Prevention Education:  Contact Attempts: Mother, Derek Sullivan [phone], (name of family member/significant other) has been identified by the patient as the family member/significant other with whom the patient will be residing, and identified as the person(s) who will aid the patient in the event of a mental health crisis.  With written consent from the patient, two attempts were made to provide suicide prevention education, prior to and/or following the patient's discharge.  We were unsuccessful in providing suicide prevention education.  A suicide education pamphlet was given to the patient to share with family/significant other.  Date and time of first attempt:9/2 @ 1510 Date and time of second attempt:TBD  Derek Sullivan 05/23/2017, 3:11 PM

## 2017-05-23 NOTE — Progress Notes (Addendum)
D: A & O X4. Denies SI, HI, AVH and pain. Presents with bright affect, visible in dayroom for groups; observed interacting well with peers and staff. Per pt "I signed a 72 hour form yesterday morning, I hope I get to go home today". Reports he slept well last night with good appetite. Rates his depression, hopelessness and anxiety all 0/10 on self inventory sheet. Pt took his morning medications with increased verbal encouragement "I was just tired this earlier this morning, I'm sorry for being late". Uses his neck brace with encouragement and at other times, "I'm ok right now, I don't want it on". A: Medications administered as prescribed, effects monitored. Safety checks continues at Q 15 minutes intervals. Support and encouragement provided to pt.  R: Pt receptive to care. Compliant with his medications. Denies adverse drug reactions when assessed. Tolerates all PO intake well. Remains safe on and off unit.

## 2017-05-23 NOTE — [facility] Group Notes (Signed)
[facility] LCSW Group Therapy Note   05/23/2017  10:10 to 11:10 AM   Type of Therapy and Topic: Group Therapy: Feelings Around Returning Home & Establishing a Supportive Framework and Self Care  Participation Level: Active   Description of Group:  Patients first processed thoughts and feelings about up coming discharge. These included fears of upcoming changes, lack of change, new living environments, judgements and expectations from others and overall stigma of MH issues. We then discussed what is a supportive framework? What does it look like feel like and how do I discern it from and unhealthy non-supportive network? Learn how to cope when supports are not helpful and don't support you. Discussion followed on self care.   Therapeutic Goals Addressed in Processing Group:  1. Patient will identify one healthy supportive network that they can use at discharge. 2. Patient will identify one factor of a supportive framework and how to tell it from an unhealthy network. 3. Patient able to identify one coping skill to use when they do not have positive supports from others. 4. Patient will demonstrate ability to communicate their needs through discussion and/or role plays.  Summary of Patient Progress:  Pt engaged easily during group session and offered support to several other peers. As patients processed their anxiety about discharge and described healthy supports patient  Shared his desire to remain positive and avoid reacting by engaging in more self care and less negative thinking.    Carney Bernatherine C Harrill, LCSW

## 2017-05-23 NOTE — Plan of Care (Signed)
Problem: Safety: Goal: Periods of time without injury will increase Outcome: Progressing Maintained on routine safety checks without self harm gestures to report thus far. Visible in milieu majority of this shift, interacted with peers and staff.   Problem: Medication: Goal: Compliance with prescribed medication regimen will improve Outcome: Progressing Pt compliant with medications. Denies adverse drug reactions when assessed.

## 2017-05-23 NOTE — Progress Notes (Signed)
[facility] Group Notes:  (Nursing/MHT/Case Management/Adjunct)  Date:  05/23/2017  Time:  9:41 PM  Type of Therapy:  Psychoeducational Skills  Participation Level:  Active  Participation Quality:  Appropriate  Affect:  Appropriate  Cognitive:  Alert  Insight:  Improving  Engagement in Group:  Distracting  Modes of Intervention:  Education  Summary of Progress/Problems: Patient sat with his back to the group and mentioned that he had a wonderful day overall. He played cards with his peers and socialized quite a bit. In terms of the theme for the day, his support system will be comprised of his friends, family, mother, and children.   Hazle CocaGOODMAN, Veldon Wager S 05/23/2017, 9:41 PM
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
## 2017-05-24 DIAGNOSIS — Z630 Problems in relationship with spouse or partner: Secondary | ICD-10-CM

## 2017-05-24 DIAGNOSIS — R4584 Anhedonia: Secondary | ICD-10-CM

## 2017-05-24 MED ORDER — TRAZODONE HCL 50 MG PO TABS: 50.0000 mg | ORAL_TABLET | Freq: Every evening | ORAL | 0 refills | Status: DC | PRN

## 2017-05-24 MED ORDER — GABAPENTIN 100 MG PO CAPS: 100.0000 mg | ORAL_CAPSULE | Freq: Three times a day (TID) | ORAL | 0 refills | Status: DC

## 2017-05-24 MED ORDER — AMLODIPINE BESYLATE 5 MG PO TABS: 5.0000 mg | ORAL_TABLET | Freq: Every day | ORAL | 0 refills | Status: DC

## 2017-05-24 MED ORDER — DULOXETINE HCL 30 MG PO CPEP: 30.0000 mg | ORAL_CAPSULE | Freq: Every day | ORAL | 0 refills | Status: DC
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
## 2017-05-24 NOTE — Progress Notes (Signed)
Recreation Therapy Notes  Date: 05/24/17 Time: 0930 Location: [address] Group Room  Group Topic: Stress Management  Goal Area(s) Addresses:  Patient will verbalize importance of using healthy stress management.  Patient will identify positive emotions associated with healthy stress management.   Behavioral Response: Engaged   Intervention: Stress Management  Activity :  Meditation.  LRT introduced the stress management technique of meditation to patients.  LRT played a meditation from the Calm app that focused on humanity.  Patients were to follow along as the meditation played to fully engage in the meditation.  Education:  Stress Management, Discharge Planning.   Education Outcome: Acknowledges edcuation/In group clarification offered/Needs additional education  Clinical Observations/Feedback: Pt attended group.    Caroll RancherMarjette Vito Beg, LRT/CTRS         Caroll RancherLindsay, Danessa Mensch A 05/24/2017 12:18 PM

## 2017-05-24 NOTE — Progress Notes (Signed)
Writer has observed patient up in the dayroom playing chess and cards with peers. He voiced no complaints and is ready to discharge. He minimizes his problems and is focused on his girlfriend and is ready to discharge. Safety maintained on unit with 15 min checks.
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
## 2017-05-24 NOTE — [facility] Suicide Risk Assessment (Signed)
[facility] INPATIENT:  Family/Significant Other Suicide Prevention Education  Suicide Prevention Education:  Contact Attempts: Lottie MusselMelissa Pittman, Pt's mother [phone], has been identified by the patient as the family member/significant other with whom the patient will be residing, and identified as the person(s) who will aid the patient in the event of a mental health crisis.  With written consent from the patient, two attempts were made to provide suicide prevention education, prior to and/or following the patient's discharge.  We were unsuccessful in providing suicide prevention education.  A suicide education pamphlet was given to the patient to share with family/significant other.  Date and time of first attempt: 05/23/17 @ 1510 Date and time of second attempt: 05/24/17 @ 0945  Verdene LennertLauren C Hulen Mandler 05/24/2017, 9:44 AM

## 2017-05-24 NOTE — Progress Notes (Signed)
Discharge note: Pt received both written and verbal discharge instructions. Pt verbalized understanding of discharge instructions. Pt agreed to f/u appt and med regimen. Pt received sample meds, prescriptions, SRA, AVS and transitional record. Pt gathered belongings from room and locker. Pt safely discharged to the lobby.

## 2017-05-24 NOTE — Progress Notes (Signed)
  [facility] :  Will you be returning to the same living situation after discharge:  No. Pt plans to live with a friend At discharge, do you have transportation home?: Yes,  Pt provided with bus pass Do you have the ability to pay for your medications: Yes,  Pt provided with samples and prescriptions  Release of information consent forms completed and in the chart;  Patient's signature needed at discharge.  Patient to Follow up at: Follow-up Information    [facility] Follow up.   Specialty:  Behavioral Health Why:  Please walk-in within 1-3 days of discharge to be assessed for medication management services. Walk-in hours are from 8am-3pm Monday-Friday.  Contact information: [address] [place] [address] [phone]        [facility] Follow up on 05/27/2017.   Specialty:  Behavioral Health Why:  at 9:30am with Theodoro Gristave for therapy. Please arrive twenty minutes early to complete paperwork.  Contact information: [address] [place] [address] [phone]           Next level of care provider [facility] Link:no  Safety Planning and Suicide Prevention discussed: Yes,  with Pt; 2 unsuccessful attempts made with mother  Have you used any form of tobacco in the last 30 days? (Cigarettes, Smokeless Tobacco, Cigars, and/or Pipes): Yes  Has patient been referred to the Quitline?: Patient refused referral  Patient has been referred for addiction treatment: Yes  Verdene LennertLauren C Rudolfo Brandow, LCSW 05/24/2017, 9:25 AM

## 2018-02-16 ENCOUNTER — Encounter (HOSPITAL_COMMUNITY): Payer: Self-pay | Admitting: Family Medicine

## 2018-02-16 ENCOUNTER — Emergency Department (HOSPITAL_COMMUNITY)
Admission: EM | Admit: 2018-02-16 | Discharge: 2018-02-16 | Disposition: A | Discharge status: Home / Self Care | Payer: Self-pay | Attending: Emergency Medicine | Admitting: Emergency Medicine

## 2018-02-16 DIAGNOSIS — Y939 Activity, unspecified: Secondary | ICD-10-CM | POA: Insufficient documentation

## 2018-02-16 DIAGNOSIS — W57XXXA Bitten or stung by nonvenomous insect and other nonvenomous arthropods, initial encounter: Secondary | ICD-10-CM

## 2018-02-16 DIAGNOSIS — I10 Essential (primary) hypertension: Secondary | ICD-10-CM | POA: Insufficient documentation

## 2018-02-16 DIAGNOSIS — S50861A Insect bite (nonvenomous) of right forearm, initial encounter: Secondary | ICD-10-CM | POA: Insufficient documentation

## 2018-02-16 DIAGNOSIS — Y999 Unspecified external cause status: Secondary | ICD-10-CM | POA: Insufficient documentation

## 2018-02-16 DIAGNOSIS — F17210 Nicotine dependence, cigarettes, uncomplicated: Secondary | ICD-10-CM | POA: Insufficient documentation

## 2018-02-16 DIAGNOSIS — Y929 Unspecified place or not applicable: Secondary | ICD-10-CM | POA: Insufficient documentation

## 2018-02-16 DIAGNOSIS — Z79899 Other long term (current) drug therapy: Secondary | ICD-10-CM | POA: Insufficient documentation

## 2018-02-16 DIAGNOSIS — L03113 Cellulitis of right upper limb: Secondary | ICD-10-CM

## 2018-02-16 MED ORDER — DOXYCYCLINE HYCLATE 100 MG PO TABS
100.0000 mg | ORAL_TABLET | Freq: Once | ORAL | Status: AC
  Administered 2018-02-16: 100 mg via ORAL
  Filled 2018-02-16: qty 1

## 2018-02-16 MED ORDER — DOXYCYCLINE HYCLATE 100 MG PO CAPS: 100.0000 mg | ORAL_CAPSULE | Freq: Two times a day (BID) | ORAL | 0 refills | Status: DC

## 2018-02-16 NOTE — ED Notes (Signed)
Pt called to be roomed x3 with no response.  Pt in lobby informed staff that pt left.  RN notified

## 2018-02-16 NOTE — Discharge Instructions (Signed)
As we discussed please take antibiotic twice a day for the next 10 days.  It is very important that you take the full course of the antibiotic.  You can use ice over the area to help with swelling.  Please take ibuprofen every 6 hours as needed for pain.  Please elevate the arm to help with swelling.  Please return to the ER if you have any new or concerning symptoms like fever, worsening redness and swelling beyond the area marked on the skin.
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
## 2018-02-16 NOTE — ED Notes (Signed)
Pt had to pick up family member stated he would return at 4p

## 2018-02-16 NOTE — ED Triage Notes (Addendum)
Patient reports he has a possible spider bite to his right forearm on Friday. No reported fevers. Patients right forearm is red and inflamed. No drainage noted.
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
## 2018-05-01 ENCOUNTER — Emergency Department (HOSPITAL_COMMUNITY)
Admission: EM | Admit: 2018-05-01 | Discharge: 2018-05-01 | Disposition: A | Discharge status: Other Acute Inpatient Hospital | Payer: Self-pay | Attending: Emergency Medicine | Admitting: Emergency Medicine

## 2018-05-01 ENCOUNTER — Encounter (HOSPITAL_COMMUNITY): Payer: Self-pay | Admitting: Emergency Medicine

## 2018-05-01 ENCOUNTER — Other Ambulatory Visit: Payer: Self-pay

## 2018-05-01 ENCOUNTER — Inpatient Hospital Stay (HOSPITAL_COMMUNITY)
Admission: AD | Admit: 2018-05-01 | Discharge: 2018-05-03 | DRG: 885 | Disposition: A | Discharge status: Home / Self Care | Payer: Federal, State, Local not specified - Other | Source: Intra-hospital | Attending: Psychiatry | Admitting: Psychiatry

## 2018-05-01 ENCOUNTER — Encounter (HOSPITAL_COMMUNITY): Payer: Self-pay | Admitting: Nurse Practitioner

## 2018-05-01 DIAGNOSIS — R451 Restlessness and agitation: Secondary | ICD-10-CM | POA: Insufficient documentation

## 2018-05-01 DIAGNOSIS — F1210 Cannabis abuse, uncomplicated: Secondary | ICD-10-CM | POA: Diagnosis present

## 2018-05-01 DIAGNOSIS — F332 Major depressive disorder, recurrent severe without psychotic features: Secondary | ICD-10-CM | POA: Diagnosis not present

## 2018-05-01 DIAGNOSIS — F17210 Nicotine dependence, cigarettes, uncomplicated: Secondary | ICD-10-CM | POA: Insufficient documentation

## 2018-05-01 DIAGNOSIS — Z62811 Personal history of psychological abuse in childhood: Secondary | ICD-10-CM | POA: Diagnosis present

## 2018-05-01 DIAGNOSIS — Z046 Encounter for general psychiatric examination, requested by authority: Secondary | ICD-10-CM | POA: Insufficient documentation

## 2018-05-01 DIAGNOSIS — I10 Essential (primary) hypertension: Secondary | ICD-10-CM | POA: Diagnosis present

## 2018-05-01 DIAGNOSIS — Z9049 Acquired absence of other specified parts of digestive tract: Secondary | ICD-10-CM | POA: Diagnosis not present

## 2018-05-01 DIAGNOSIS — F4310 Post-traumatic stress disorder, unspecified: Secondary | ICD-10-CM | POA: Diagnosis present

## 2018-05-01 DIAGNOSIS — R45851 Suicidal ideations: Secondary | ICD-10-CM | POA: Insufficient documentation

## 2018-05-01 DIAGNOSIS — G4700 Insomnia, unspecified: Secondary | ICD-10-CM | POA: Diagnosis present

## 2018-05-01 DIAGNOSIS — Z62810 Personal history of physical and sexual abuse in childhood: Secondary | ICD-10-CM | POA: Diagnosis present

## 2018-05-01 DIAGNOSIS — F329 Major depressive disorder, single episode, unspecified: Secondary | ICD-10-CM | POA: Insufficient documentation

## 2018-05-01 DIAGNOSIS — Z79899 Other long term (current) drug therapy: Secondary | ICD-10-CM | POA: Insufficient documentation

## 2018-05-01 HISTORY — DX: Post-traumatic stress disorder, unspecified: F43.10

## 2018-05-01 LAB — CBC
HCT: 42.8 % (ref 39.0–52.0)
Hemoglobin: 14.5 g/dL (ref 13.0–17.0)
MCH: 32.4 pg (ref 26.0–34.0)
MCHC: 33.9 g/dL (ref 30.0–36.0)
MCV: 95.7 fL (ref 78.0–100.0)
Platelets: 290 10*3/uL (ref 150–400)
RBC: 4.47 MIL/uL (ref 4.22–5.81)
RDW: 11.9 % (ref 11.5–15.5)
WBC: 12 10*3/uL — AB (ref 4.0–10.5)

## 2018-05-01 LAB — RAPID URINE DRUG SCREEN, HOSP PERFORMED
AMPHETAMINES: NOT DETECTED
BENZODIAZEPINES: NOT DETECTED
Barbiturates: NOT DETECTED
COCAINE: NOT DETECTED
OPIATES: NOT DETECTED
TETRAHYDROCANNABINOL: POSITIVE — AB

## 2018-05-01 LAB — SALICYLATE LEVEL: Salicylate Lvl: <7 mg/dL (ref 2.8–30.0)

## 2018-05-01 LAB — COMPREHENSIVE METABOLIC PANEL
ALBUMIN: 4.5 g/dL (ref 3.5–5.0)
ALT: 27 U/L (ref 0–44)
ANION GAP: 10 (ref 5–15)
AST: 28 U/L (ref 15–41)
Alkaline Phosphatase: 65 U/L (ref 38–126)
BUN: 10 mg/dL (ref 6–20)
CHLORIDE: 110 mmol/L (ref 98–111)
CO2: 22 mmol/L (ref 22–32)
Calcium: 9.6 mg/dL (ref 8.9–10.3)
Creatinine, Ser: 1.03 mg/dL (ref 0.61–1.24)
GFR calc Af Amer: >60 mL/min (ref >60)
GFR calc non Af Amer: >60 mL/min (ref >60)
Glucose, Bld: 109 mg/dL — ABNORMAL HIGH (ref 70–99)
Potassium: 3.9 mmol/L (ref 3.5–5.1)
SODIUM: 142 mmol/L (ref 135–145)
Total Bilirubin: 0.8 mg/dL (ref 0.3–1.2)
Total Protein: 7.5 g/dL (ref 6.5–8.1)

## 2018-05-01 LAB — ETHANOL: Alcohol, Ethyl (B): <10 mg/dL (ref <10)

## 2018-05-01 LAB — ACETAMINOPHEN LEVEL

## 2018-05-01 MED ORDER — DOXYCYCLINE HYCLATE 100 MG PO TABS
100.0000 mg | ORAL_TABLET | Freq: Two times a day (BID) | ORAL | Status: DC
  Administered 2018-05-01: 100 mg via ORAL
  Filled 2018-05-01: qty 1

## 2018-05-01 MED ORDER — DULOXETINE HCL 30 MG PO CPEP
30.0000 mg | ORAL_CAPSULE | Freq: Every day | ORAL | Status: DC
  Administered 2018-05-01: 30 mg via ORAL
  Filled 2018-05-01: qty 1

## 2018-05-01 MED ORDER — GABAPENTIN 100 MG PO CAPS
100.0000 mg | ORAL_CAPSULE | Freq: Three times a day (TID) | ORAL | Status: DC
  Administered 2018-05-01: 100 mg via ORAL
  Filled 2018-05-01: qty 1

## 2018-05-01 MED ORDER — TRAZODONE HCL 50 MG PO TABS
50.0000 mg | ORAL_TABLET | Freq: Every evening | ORAL | Status: DC | PRN
  Administered 2018-05-02: 50 mg via ORAL
  Filled 2018-05-01: qty 1
  Filled 2018-05-01: qty 7
  Filled 2018-05-01: qty 1

## 2018-05-01 MED ORDER — LORAZEPAM 1 MG PO TABS
1.0000 mg | ORAL_TABLET | Freq: Once | ORAL | Status: AC
  Administered 2018-05-01: 1 mg via ORAL
  Filled 2018-05-01: qty 1

## 2018-05-01 MED ORDER — TRAZODONE HCL 50 MG PO TABS: 50.0000 mg | ORAL_TABLET | Freq: Every evening | ORAL | Status: DC | PRN

## 2018-05-01 MED ORDER — HYDROXYZINE HCL 50 MG PO TABS
50.0000 mg | ORAL_TABLET | Freq: Three times a day (TID) | ORAL | Status: DC | PRN
  Administered 2018-05-01 – 2018-05-02 (×2): 50 mg via ORAL
  Filled 2018-05-01 (×2): qty 1
  Filled 2018-05-01: qty 10
  Filled 2018-05-01: qty 1

## 2018-05-01 MED ORDER — DOXYCYCLINE HYCLATE 100 MG PO TABS: 100.0000 mg | ORAL_TABLET | Freq: Two times a day (BID) | ORAL | Status: DC

## 2018-05-01 MED ORDER — AMLODIPINE BESYLATE 5 MG PO TABS
5.0000 mg | ORAL_TABLET | Freq: Every day | ORAL | Status: DC
Start: 2018-05-02 — End: 2018-05-03
  Administered 2018-05-02 – 2018-05-03 (×2): 5 mg via ORAL
  Filled 2018-05-01: qty 7
  Filled 2018-05-01 (×4): qty 1

## 2018-05-01 MED ORDER — IBUPROFEN 800 MG PO TABS
800.0000 mg | ORAL_TABLET | Freq: Three times a day (TID) | ORAL | Status: DC | PRN
  Administered 2018-05-01: 800 mg via ORAL
  Filled 2018-05-01: qty 1

## 2018-05-01 MED ORDER — DULOXETINE HCL 30 MG PO CPEP
30.0000 mg | ORAL_CAPSULE | Freq: Every day | ORAL | Status: DC
  Administered 2018-05-02 – 2018-05-03 (×2): 30 mg via ORAL
  Filled 2018-05-01 (×3): qty 1
  Filled 2018-05-01: qty 7
  Filled 2018-05-01: qty 1

## 2018-05-01 MED ORDER — AMLODIPINE BESYLATE 5 MG PO TABS
5.0000 mg | ORAL_TABLET | Freq: Every day | ORAL | Status: DC
Start: 2018-05-01 — End: 2018-05-01
  Administered 2018-05-01: 5 mg via ORAL
  Filled 2018-05-01: qty 1

## 2018-05-01 MED ORDER — GABAPENTIN 100 MG PO CAPS
100.0000 mg | ORAL_CAPSULE | Freq: Three times a day (TID) | ORAL | Status: DC
  Administered 2018-05-01 – 2018-05-03 (×6): 100 mg via ORAL
  Filled 2018-05-01 (×2): qty 21
  Filled 2018-05-01 (×7): qty 1
  Filled 2018-05-01: qty 21
  Filled 2018-05-01 (×5): qty 1

## 2018-05-01 NOTE — ED Notes (Signed)
Declined W/C at D/C and was escorted to lobby by RN. 

## 2018-05-01 NOTE — ED Triage Notes (Signed)
PT moved to F-10 . Pt sitting on side of bed with face in his hands. Pt does not answer questions. Pt now is lying on his back with head at foot of bed.

## 2018-05-01 NOTE — Progress Notes (Signed)
Per Steffanie RainwaterLindsey AC, [facility] Accepting provider is Rankin. Attending provider is Dr. Jama Flavorsobos. Patient can arrive by 1:30 Pm. Number for report is [phone].  Moss McKy-Sha Damika Harmon MSW, LCSW, LCAS Clinical Social Worker 05/01/2018 12:38 PM

## 2018-05-01 NOTE — Plan of Care (Signed)
D: Pt been sleep in room most of the evening A: Pt was offered support and encouragement. Pt was given scheduled medications. Pt was encourage to attend groups. Q 15 minute checks were done for safety.  R:  safety maintained on unit.   Problem: Activity: Goal: Sleeping patterns will improve Outcome: Progressing
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
## 2018-05-01 NOTE — BHH Counselor (Signed)
Cannot complete assessment at this time -- cart in use.
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
## 2018-05-01 NOTE — Progress Notes (Signed)
Patient did not attend the evening speaker AA meeting. Pt was notified that group was beginning but remained in bed.   
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
## 2018-05-01 NOTE — ED Triage Notes (Signed)
TTS At bedside. 

## 2018-05-01 NOTE — Consult Note (Signed)
  Tele Assessment   Derek Sullivan, 27 y.o., male patient seen via telepsych by this provider; chart reviewed and consulted with Dr. Lucianne MussKumar on 05/01/18.  On evaluation Derek Sullivan reports that he feels that his life is unstable and that he doesn't know what to do to get back on track.  Patient states that he is having suicidal thoughts and that he does not feel safe.  Reports that last night he had the thought to jump out of car while it was still moving.  States that he does not have any support other than his girlfriend who is also having issues of her own;  and feels that he is always blamed for everything.  Patient states that he has been having suicidal thoughts for a while but at this time he is feeling overwhelmed and that it has gotten to the point where he actually wants to die.  Patient is unable to contract for safety. During evaluation Derek Sullivan is alert/oriented x 4; calm/cooperative; and mood congruent with affect.  He  does not appear to be responding to internal/external stimuli or delusional thoughts.  Patient denies homicidal ideation, psychosis, and paranoia; but continues to endorse suicidal ideation and is unable to contract for safety.  Patient answered question appropriately.  For detailed note see TTS tele assessment note.      Recommendations:  Inpatient psychiatric treatment  Disposition: Recommend psychiatric Inpatient admission when medically cleared.   Shuvon B. Rankin, NP

## 2018-05-02 DIAGNOSIS — F332 Major depressive disorder, recurrent severe without psychotic features: Principal | ICD-10-CM

## 2018-05-02 MED ORDER — NICOTINE 21 MG/24HR TD PT24
21.0000 mg | MEDICATED_PATCH | Freq: Every day | TRANSDERMAL | Status: DC
  Administered 2018-05-02 – 2018-05-03 (×2): 21 mg via TRANSDERMAL
  Filled 2018-05-02 (×4): qty 1

## 2018-05-02 NOTE — Plan of Care (Signed)
Pt progressing in the following metrics  D: pt found in bed this morning; compliant with medication administration. Pt denies any si/hi/ah/vh and verbally agrees to approach staff if these become apparent. Pt rates his depression/hopelessness/anxiety all 8/10. Pt denies any physical pain at this time rating this a 0/10. Pt states his goal for today is to socialize more and will achieve this by being more social. Pt's blood pressure was elevated, MD notified and medication provided.  A: pt provided support and encouragement. [id number] safety checks implemented and continued. Pt given medications per protocol and standing orders.  R: pt safe on the unit. Will continue to monitor.   Problem: Education: Goal: Emotional status will improve Outcome: Progressing   Problem: Activity: Goal: Interest or engagement in activities will improve Outcome: Progressing   Problem: Coping: Goal: Ability to verbalize frustrations and anger appropriately will improve Outcome: Progressing   Problem: Health Behavior/Discharge Planning: Goal: Compliance with treatment plan for underlying cause of condition will improve Outcome: Progressing   Problem: Physical Regulation: Goal: Ability to maintain clinical measurements within normal limits will improve Outcome: Progressing   Problem: Safety: Goal: Periods of time without injury will increase Outcome: Progressing

## 2018-05-02 NOTE — Progress Notes (Signed)
D: Pt was in his room upon initial approach.  Pt presents with anxious affect and mood.  He reports he had a good visit with his girlfriend tonight.  His goal is to work on his anxiety.  Pt denies SI/HI, denies hallucinations, denies pain.  Pt has been visible in milieu interacting with peers and staff appropriately.     A: Introduced self to pt.  Actively listened to pt and offered support and encouragement. PRN medication administered for anxiety and sleep.  Q15 minute safety checks maintained.  R: Pt is safe on the unit.  Pt is compliant with medications.  Pt verbally contracts for safety.  Will continue to monitor and assess.

## 2018-05-02 NOTE — Progress Notes (Signed)
Hospitalist called about patient's hypertension.  Patient not on antihypertensives.  Blood pressure systolic 140s to 191Y170s.  Norvasc 5 mg started today. Assessment and plan -agree with 5 mg Norvasc daily- advantage does not require blood work for monitoring, dose can be increased to 10 mg in ~ 3 days if needed, to target blood pressure less than 140/90. -Will need to establish care with a primary care provider on discharge. Thank you for this consult.  Derek SallesEjiro Emokpae, MD. Darcel SmallingRH. 05/02/18.  LOS-no charge.

## 2018-05-02 NOTE — [facility] Suicide Risk Assessment (Signed)
[facility] INPATIENT:  Family/Significant Other Suicide Prevention Education  Suicide Prevention Education:  Patient Refusal for Family/Significant Other Suicide Prevention Education: The patient Derek Sullivan has refused to provide written consent for family/significant other to be provided Family/Significant Other Suicide Prevention Education during admission and/or prior to discharge.  Physician notified.  SPE completed with pt, as pt refused to consent to family contact. SPI pamphlet provided to pt and pt was encouraged to share information with support network, ask questions, and talk about any concerns relating to SPE. Pt denies access to guns/firearms and verbalized understanding of information provided. Mobile Crisis information also provided to pt.   Rona RavensHeather S Vieva Brummitt LCSW 05/02/2018, 1:04 PM
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
## 2018-05-02 NOTE — Tx Team (Signed)
Interdisciplinary Treatment and Diagnostic Plan Update  05/02/2018 Time of Session: 0830AM Derek Sullivan MRN: [id number]  Principal Diagnosis: MDD, recurrent, severe, without psychosis  Secondary Diagnoses: Active Problems:   MDD (major depressive disorder), recurrent severe, without psychosis ([place])   Current Medications:  Current Facility-Administered Medications  Medication Dose Route Frequency Provider Last Rate Last Dose  . amLODipine (NORVASC) tablet 5 mg  5 mg Oral Daily Rankin, Shuvon B, NP   5 mg at 05/02/18 0802  . DULoxetine (CYMBALTA) DR capsule 30 mg  30 mg Oral Daily Rankin, Shuvon B, NP   30 mg at 05/02/18 0802  . gabapentin (NEURONTIN) capsule 100 mg  100 mg Oral TID Rankin, Shuvon B, NP   100 mg at 05/02/18 0802  . hydrOXYzine (ATARAX/VISTARIL) tablet 50 mg  50 mg Oral TID PRN Money, Lowry Ram, FNP   50 mg at 05/01/18 1648  . ibuprofen (ADVIL,MOTRIN) tablet 800 mg  800 mg Oral Q8H PRN Money, Lowry Ram, FNP   800 mg at 05/01/18 1648  . traZODone (DESYREL) tablet 50 mg  50 mg Oral QHS PRN Rankin, Shuvon B, NP       PTA Medications: Medications Prior to Admission  Medication Sig Dispense Refill Last Dose  . gabapentin (NEURONTIN) 100 MG capsule Take 1 capsule (100 mg total) by mouth 3 (three) times daily. 90 capsule 0     Treatment Modalities: Medication Management, Group therapy, Case management,  1 to 1 session with clinician, Psychoeducation, Recreational therapy.   Physician Treatment Plan for Primary Diagnosis: <principal problem not specified>  Medication Management: Evaluate patient's response, side effects, and tolerance of medication regimen.  Therapeutic Interventions: 1 to 1 sessions, Unit Group sessions and Medication administration.  Evaluation of Outcomes: Not Met  Physician Treatment Plan for Secondary Diagnosis: Active Problems:   MDD (major depressive disorder), recurrent severe, without psychosis ([place])   Medication Management: Evaluate  patient's response, side effects, and tolerance of medication regimen.  Therapeutic Interventions: 1 to 1 sessions, Unit Group sessions and Medication administration.  Evaluation of Outcomes: Not Met   RN Treatment Plan for Primary Diagnosis: MDD, recurrent, severe, without psychosis Long Term Goal(s): Knowledge of disease and therapeutic regimen to maintain health will improve  Short Term Goals: Ability to remain free from injury will improve, Ability to demonstrate self-control, Ability to disclose and discuss suicidal ideas and Ability to identify and develop effective coping behaviors will improve  Medication Management: RN will administer medications as ordered by provider, will assess and evaluate patient's response and provide education to patient for prescribed medication. RN will report any adverse and/or side effects to prescribing provider.  Therapeutic Interventions: 1 on 1 counseling sessions, Psychoeducation, Medication administration, Evaluate responses to treatment, Monitor vital signs and CBGs as ordered, Perform/monitor CIWA, COWS, AIMS and Fall Risk screenings as ordered, Perform wound care treatments as ordered.  Evaluation of Outcomes: Not Met   LCSW Treatment Plan for Primary Diagnosis: Long Term Goal(s): Safe transition to appropriate next level of care at discharge, Engage patient in therapeutic group addressing interpersonal concerns.  Short Term Goals: Engage patient in aftercare planning with referrals and resources, Facilitate patient progression through stages of change regarding substance use diagnoses and concerns and Identify triggers associated with mental health/substance abuse issues  Therapeutic Interventions: Assess for all discharge needs, 1 to 1 time with Social worker, Explore available resources and support systems, Assess for adequacy in community support network, Educate family and significant other(s) on suicide prevention, Complete Psychosocial  Assessment,  Interpersonal group therapy.  Evaluation of Outcomes: Not Met   Progress in Treatment: Attending groups: No. New to unit. Continuing to assess.  Participating in groups: No. Taking medication as prescribed: Yes. Toleration medication: Yes. Family/Significant other contact made: No, will contact:  family member if pt consents to collateral contact.  Patient understands diagnosis: Yes. Discussing patient identified problems/goals with staff: Yes. Medical problems stabilized or resolved: Yes. Denies suicidal/homicidal ideation: Yes. Issues/concerns per patient self-inventory: No. Other: n/a   New problem(s) identified: No, Describe:  n/a  New Short Term/Long Term Goal(s): detox, medication management for mood stabilization; elimination of SI thoughts; development of comprehensive mental wellness/sobriety plan.   Patient Goals:  To learn how to handle life stress better.   Discharge Plan or Barriers: CSW assessing. Pt reports he is currently homeless and has no outpatient providers. [place] pamphlet, Mobile Crisis information, and AA/NA information provided to patient for additional community support and resources.   Reason for Continuation of Hospitalization: Anxiety Depression Medication stabilization Suicidal ideation Withdrawal symptoms  Estimated Length of Stay: Thursday, 05/05/18  Attendees: Patient: 05/02/2018 8:45 AM  Physician: Dr. Parke Poisson MD; Dr. Nancy Fetter MD 05/02/2018 8:45 AM  Nursing: Demaris Callander; Mateo Flow RN 05/02/2018 8:45 AM  RN Care Manager:x 05/02/2018 8:45 AM  Social Worker: Janice Norrie LCSW 05/02/2018 8:45 AM  Recreational Therapist: x 05/02/2018 8:45 AM  Other: Ricky Ala NP 05/02/2018 8:45 AM  Other:  05/02/2018 8:45 AM  Other: 05/02/2018 8:45 AM    Scribe for Treatment Team: Avelina Laine, LCSW 05/02/2018 8:45 AM

## 2018-05-03 LAB — TSH: TSH: 0.983 u[IU]/mL (ref 0.350–4.500)

## 2018-05-03 MED ORDER — TRAZODONE HCL 50 MG PO TABS: 50.0000 mg | ORAL_TABLET | Freq: Every evening | ORAL | 0 refills | Status: DC | PRN

## 2018-05-03 MED ORDER — NICOTINE 21 MG/24HR TD PT24: 21.0000 mg | MEDICATED_PATCH | Freq: Every day | TRANSDERMAL | 0 refills | Status: DC

## 2018-05-03 MED ORDER — HYDROXYZINE HCL 50 MG PO TABS: 50.0000 mg | ORAL_TABLET | Freq: Three times a day (TID) | ORAL | 0 refills | Status: DC | PRN

## 2018-05-03 MED ORDER — DULOXETINE HCL 30 MG PO CPEP: 30.0000 mg | ORAL_CAPSULE | Freq: Every day | ORAL | 0 refills | Status: DC

## 2018-05-03 MED ORDER — GABAPENTIN 100 MG PO CAPS: 100.0000 mg | ORAL_CAPSULE | Freq: Three times a day (TID) | ORAL | 0 refills | Status: DC

## 2018-05-03 MED ORDER — AMLODIPINE BESYLATE 5 MG PO TABS: 5.0000 mg | ORAL_TABLET | Freq: Every day | ORAL | 0 refills | Status: DC
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
## 2018-05-03 NOTE — Progress Notes (Signed)
  [facility] :  Will you be returning to the same living situation after discharge:  Yes,  home At discharge, do you have transportation home?: Yes,  bus Do you have the ability to pay for your medications: Yes,  mental health  Release of information consent forms completed and submitted to medical records by CSW.  Patient to Follow up at: Follow-up Information    [facility] Follow up on 05/11/2018.   Specialty:  Behavioral Health Why:  Hospital follow-up appt on Wed, 8/21 at 8:00AM. Please bring: photo ID, social security card, any proof of income if you have it, and hospital discharge paperwork to this appt. Thank you.  Contact information: [address] [place] [address] [phone]           Next level of care provider has [facility] to [facility] Link:no  Safety Planning and Suicide Prevention discussed: Yes,  SPE completed with pt; pt declined to consent to collateral contact. [facility] pamphlet and mobile crisis information provided.   Has patient been referred to the Quitline?: Patient refused referral  Patient has been referred for addiction treatment: Yes  Rona RavensHeather S Azuri Bozard, LCSW 05/03/2018, 10:58 AM

## 2018-05-03 NOTE — Progress Notes (Signed)
Patient ID: Derek Sullivan, male   DOB: April 17, 1991, 27 y.o.   MRN: [id number]  Nursing Progress Note [id number]  Data: Patient presents with flat affect but engages with writer during interactions. Patient reports he feels triggered because his girlfriend isn't answering the phone. Patient reports this stems from his mom not being emotionally available to him. Patient states, "I have to learn to let go of this anger towards her". Patient reports his current support system is his aunt and cousin. Patient reports he is discharging today. Suicide Safety Plan completed with Clinical research associatewriter. Patient demonstrates fair insight into his triggers and coping skills. Patient complaint with scheduled medications. Patient denies pain/physical complaints. Patient completed self-inventory sheet and rates depression, hopelessness, and anxiety 5,5,5 respectively. Patient rates their sleep and appetite as fair/fair respectively. Patient states goal for today is to "increase my energy level". Patient currently denies SI/HI/AVH.   Action: Patient educated about and provided medication per provider's orders. Patient safety maintained with q15 min safety checks and frequent rounding. High fall risk precautions in place. Emotional support given. 1:1 interaction and active listening provided. Patient encouraged to attend meals and groups. Patient encouraged to work on treatment plan and goals. Labs, vital signs and patient behavior monitored throughout shift.   Response: Patient agrees to come to staff if any thoughts of SI/HI develop or if patient develops intention of acting on thoughts. Patient remains safe on the unit at this time. Will continue to support and monitor.

## 2018-05-03 NOTE — Progress Notes (Signed)
Patient ID: Derek Sullivan, male   DOB: 03/16/1991, 27 y.o.   MRN: [id number]  Discharge Note  D) Patient discharged to lobby. Patient states readiness for discharge. Patient denies SI/HI, AVH and is not delusional or psychotic. Patient in no acute distress. Patient has completed their Suicide Safety Plan and has been provided copies.  A) Written and verbal discharge instructions given to the patient. Patient accepting to information and verbalized understanding. Patient agrees to the discharge plan. Opportunity for questions and concerns presented to patient. Patient denied any further questions or concerns. All belongings returned to patient. Patient signed for return of belongings and discharge paperwork.   R) Patient safely escorted to the lobby. [facility] medication samples, prescriptions, personal belongings, follow-up appointment in place and discharge paperwork.
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
## 2019-04-13 ENCOUNTER — Other Ambulatory Visit: Payer: Self-pay

## 2019-04-13 ENCOUNTER — Emergency Department (HOSPITAL_COMMUNITY)
Admission: EM | Admit: 2019-04-13 | Discharge: 2019-04-13 | Disposition: A | Discharge status: Home / Self Care | Payer: Self-pay | Attending: Emergency Medicine | Admitting: Emergency Medicine

## 2019-04-13 ENCOUNTER — Encounter (HOSPITAL_COMMUNITY): Payer: Self-pay | Admitting: *Deleted

## 2019-04-13 DIAGNOSIS — S61431A Puncture wound without foreign body of right hand, initial encounter: Secondary | ICD-10-CM | POA: Insufficient documentation

## 2019-04-13 DIAGNOSIS — Y939 Activity, unspecified: Secondary | ICD-10-CM | POA: Insufficient documentation

## 2019-04-13 DIAGNOSIS — W260XXA Contact with knife, initial encounter: Secondary | ICD-10-CM | POA: Insufficient documentation

## 2019-04-13 DIAGNOSIS — Z79899 Other long term (current) drug therapy: Secondary | ICD-10-CM | POA: Insufficient documentation

## 2019-04-13 DIAGNOSIS — Y929 Unspecified place or not applicable: Secondary | ICD-10-CM | POA: Insufficient documentation

## 2019-04-13 DIAGNOSIS — I10 Essential (primary) hypertension: Secondary | ICD-10-CM | POA: Insufficient documentation

## 2019-04-13 DIAGNOSIS — F17210 Nicotine dependence, cigarettes, uncomplicated: Secondary | ICD-10-CM | POA: Insufficient documentation

## 2019-04-13 DIAGNOSIS — Y999 Unspecified external cause status: Secondary | ICD-10-CM | POA: Insufficient documentation
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
## 2019-04-13 NOTE — Discharge Instructions (Addendum)
Please read attached information. If you experience any new or worsening signs or symptoms please return to the emergency room for evaluation. Please follow-up with your primary care provider or specialist as discussed.  °

## 2019-09-04 ENCOUNTER — Encounter (HOSPITAL_COMMUNITY): Payer: Self-pay | Admitting: Emergency Medicine

## 2019-09-04 ENCOUNTER — Other Ambulatory Visit: Payer: Self-pay

## 2019-09-04 ENCOUNTER — Emergency Department (HOSPITAL_COMMUNITY)
Admission: EM | Admit: 2019-09-04 | Discharge: 2019-09-04 | Disposition: A | Discharge status: Home / Self Care | Payer: Self-pay | Attending: Emergency Medicine | Admitting: Emergency Medicine

## 2019-09-04 DIAGNOSIS — U071 COVID-19: Secondary | ICD-10-CM | POA: Insufficient documentation

## 2019-09-04 DIAGNOSIS — F17210 Nicotine dependence, cigarettes, uncomplicated: Secondary | ICD-10-CM | POA: Insufficient documentation

## 2019-09-04 DIAGNOSIS — B349 Viral infection, unspecified: Secondary | ICD-10-CM

## 2019-09-04 DIAGNOSIS — Z79899 Other long term (current) drug therapy: Secondary | ICD-10-CM | POA: Insufficient documentation

## 2019-09-04 DIAGNOSIS — M791 Myalgia, unspecified site: Secondary | ICD-10-CM

## 2019-09-04 NOTE — Discharge Instructions (Addendum)
You were seen in the ED for body aches, fevers, chills and symptoms of possible COVID.   I suspect you have a virus.    We tested your for COVID-19 (coronavirus) infection.  It is also possible you could have other viral upper respiratory infection from another virus.  COVID test results come back in 48-72 hours, sometimes sooner.  Someone will call you to notify you of results.  You can also check MyChart for formal results that will be posted.   Treatment of your illness and symptoms for now will include self-isolation, monitoring of symptoms and supportive care with over-the-counter medicines.    Stay well-hydrated. Rest. You can use over the counter medications to help with symptoms: [phone] mg acetaminophen (tylenol) every 6 hours, around the clock to help with associated fevers, sore throat, headaches, generalized body aches and malaise.  Oxymetazoline (afrin) intranasal spray once daily for no more than 3 days to help with congestion, after 3 days you can switch to another over-the-counter nasal steroid spray such as fluticasone (flonase) Allergy medication (loratadine, cetirizine, etc) and phenylephrine (sudafed) help with nasal congestion, runny nose and postnasal drip.   Dextromethorphan (Delsym) to suppress dry cough. Frequent coughing is likely causing your chest wall pain Guaifenesin (Mucinex) to help expectorate mucus and cough Wash your hands often to prevent spread.  Stay hydrated with plenty of clear fluids Rest   Return to the ED if symptoms are worsening or severe, there is increased work of breathing, chest pain or shortness of breath with exertion or activity, inability to tolerate fluids due to persistent vomiting despite nausea medicines, passing out, light headedness.  If your test results are POSITIVE, the following isolation requirements need to be met to return to work and resume essential activities: At least 10 days since symptom onset  72 hours of absence of  fever without antifever medicine (ibuprofen, acetaminophen). A fever is temperature of 100.25F or greater. Improvement of respiratory symptoms  If your test is NEGATIVE, you may return to work and essential activities as long as your symptoms have improved and you do not have a fever (100.4 F) for a total of 3 days.  Call your job and notify them that your test result was negative to see if they will allow you to return to work. Sometimes tests done early on in the illness can be false negative.  If your test was negative at first but your symptoms are not improving or you develop new symptoms, you may need to be retested.     Infection Prevention Recommendations for Individuals Confirmed to have, or Being Evaluated for, or have symptoms of 2019 Novel Coronavirus (COVID-19) Infection Who Receive Care at Home  Individuals who are confirmed to have, or are being evaluated for, COVID-19 should follow the prevention steps below until a healthcare provider or local or state health department says they can return to normal activities.  Stay home except to get medical care You should restrict activities outside your home, except for getting medical care. Do not go to work, school, or public areas, and do not use public transportation or taxis.  Call ahead before visiting your doctor Before your medical appointment, call the healthcare provider and tell them that you have, or are being evaluated for, COVID-19 infection. This will help the healthcare provider's office take steps to keep other people from getting infected. Ask your healthcare provider to call the local or state health department.  Monitor your symptoms Seek prompt medical attention if  your illness is worsening (e.g., difficulty breathing). Before going to your medical appointment, call the healthcare provider and tell them that you have, or are being evaluated for, COVID-19 infection. Ask your healthcare provider to call the local or  state health department.  Wear a facemask You should wear a facemask that covers your nose and mouth when you are in the same room with other people and when you visit a healthcare provider. People who live with or visit you should also wear a facemask while they are in the same room with you.  Separate yourself from other people in your home As much as possible, you should stay in a different room from other people in your home. Also, you should use a separate bathroom, if available.  Avoid sharing household items You should not share dishes, drinking glasses, cups, eating utensils, towels, bedding, or other items with other people in your home. After using these items, you should wash them thoroughly with soap and water.  Cover your coughs and sneezes Cover your mouth and nose with a tissue when you cough or sneeze, or you can cough or sneeze into your sleeve. Throw used tissues in a lined trash can, and immediately wash your hands with soap and water for at least 20 seconds or use an alcohol-based hand rub.  Wash your [facility] your hands often and thoroughly with soap and water for at least 20 seconds. You can use an alcohol-based hand sanitizer if soap and water are not available and if your hands are not visibly dirty. Avoid touching your eyes, nose, and mouth with unwashed hands.   Prevention Steps for Caregivers and Household Members of Individuals Confirmed to have, or Being Evaluated for, or have symptoms of 2019 Novel Coronavirus (COVID-19) Infection Being Cared for in the Home  If you live with, or provide care at home for, a person confirmed to have, or being evaluated for, COVID-19 infection please follow these guidelines to prevent infection:  Follow healthcare provider's instructions Make sure that you understand and can help the patient follow any healthcare provider instructions for all care.  Provide for the patient's basic needs You should help the patient with basic  needs in the home and provide support for getting groceries, prescriptions, and other personal needs.  Monitor the patient's symptoms If they are getting sicker, call his or her medical provider and tell them that the patient has, or is being evaluated for, COVID-19 infection. This will help the healthcare provider's office take steps to keep other people from getting infected. Ask the healthcare provider to call the local or state health department.  Limit the number of people who have contact with the patient If possible, have only one caregiver for the patient. Other household members should stay in another home or place of residence. If this is not possible, they should stay in another room, or be separated from the patient as much as possible. Use a separate bathroom, if available. Restrict visitors who do not have an essential need to be in the home.  Keep older adults, very young children, and other sick people away from the patient Keep older adults, very young children, and those who have compromised immune systems or chronic health conditions away from the patient. This includes people with chronic heart, lung, or kidney conditions, diabetes, and cancer.  Ensure good ventilation Make sure that shared spaces in the home have good air flow, such as from an air conditioner or an opened window, weather  permitting.  Wash your hands often Wash your hands often and thoroughly with soap and water for at least 20 seconds. You can use an alcohol based hand sanitizer if soap and water are not available and if your hands are not visibly dirty. Avoid touching your eyes, nose, and mouth with unwashed hands. Use disposable paper towels to dry your hands. If not available, use dedicated cloth towels and replace them when they become wet.  Wear a facemask and gloves Wear a disposable facemask at all times in the room and gloves when you touch or have contact with the patient's blood, body fluids,  and/or secretions or excretions, such as sweat, saliva, sputum, nasal mucus, vomit, urine, or feces.  Ensure the mask fits over your nose and mouth tightly, and do not touch it during use. Throw out disposable facemasks and gloves after using them. Do not reuse. Wash your hands immediately after removing your facemask and gloves. If your personal clothing becomes contaminated, carefully remove clothing and launder. Wash your hands after handling contaminated clothing. Place all used disposable facemasks, gloves, and other waste in a lined container before disposing them with other household waste. Remove gloves and wash your hands immediately after handling these items.  Do not share dishes, glasses, or other household items with the patient Avoid sharing household items. You should not share dishes, drinking glasses, cups, eating utensils, towels, bedding, or other items with a patient who is confirmed to have, or being evaluated for, COVID-19 infection. After the person uses these items, you should wash them thoroughly with soap and water.  Wash laundry thoroughly Immediately remove and wash clothes or bedding that have blood, body fluids, and/or secretions or excretions, such as sweat, saliva, sputum, nasal mucus, vomit, urine, or feces, on them. Wear gloves when handling laundry from the patient. Read and follow directions on labels of laundry or clothing items and detergent. In general, wash and dry with the warmest temperatures recommended on the label.  Clean all areas the individual has used often Clean all touchable surfaces, such as counters, tabletops, doorknobs, bathroom fixtures, toilets, phones, keyboards, tablets, and bedside tables, every day. Also, clean any surfaces that may have blood, body fluids, and/or secretions or excretions on them. Wear gloves when cleaning surfaces the patient has come in contact with. Use a diluted bleach solution (e.g., dilute bleach with 1 part bleach  and 10 parts water) or a household disinfectant with a label that says EPA-registered for coronaviruses. To make a bleach solution at home, add 1 tablespoon of bleach to 1 quart (4 cups) of water. For a larger supply, add  cup of bleach to 1 gallon (16 cups) of water. Read labels of cleaning products and follow recommendations provided on product labels. Labels contain instructions for safe and effective use of the cleaning product including precautions you should take when applying the product, such as wearing gloves or eye protection and making sure you have good ventilation during use of the product. Remove gloves and wash hands immediately after cleaning.  Monitor yourself for signs and symptoms of illness Caregivers and household members are considered close contacts, should monitor their health, and will be asked to limit movement outside of the home to the extent possible. Follow the monitoring steps for close contacts listed on the symptom monitoring form.  ? If you have additional questions, contact your local health department or call the epidemiologist on call at [phone] (available 24/7). ? This guidance is subject to change. For the  most up-to-date guidance from [facility], please refer to their website: [email or web address]

## 2019-09-04 NOTE — ED Triage Notes (Signed)
Pt coming from home today complaint of flu like symptoms starting Friday. Pt states he has been having some body aches and fever since yesterday afternoon.
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
## 2019-09-04 NOTE — ED Notes (Signed)
Pt discharge instructions reviewed with the patient. The patient verbalized understanding of discharge instructions. Pt discharged. 

## 2019-09-05 LAB — NOVEL CORONAVIRUS, NAA (HOSP ORDER, SEND-OUT TO REF LAB; TAT 18-24 HRS): SARS-CoV-2, NAA: DETECTED — AB

## 2019-09-06 ENCOUNTER — Telehealth (HOSPITAL_COMMUNITY): Payer: Self-pay

## 2019-09-07 ENCOUNTER — Telehealth (HOSPITAL_COMMUNITY): Payer: Self-pay

## 2020-09-26 ENCOUNTER — Other Ambulatory Visit: Payer: Self-pay

## 2020-09-26 ENCOUNTER — Encounter (HOSPITAL_COMMUNITY): Payer: Self-pay

## 2020-09-26 ENCOUNTER — Emergency Department (HOSPITAL_COMMUNITY)
Admission: EM | Admit: 2020-09-26 | Discharge: 2020-09-27 | Disposition: A | Discharge status: Home / Self Care | Payer: Self-pay | Attending: Emergency Medicine | Admitting: Emergency Medicine

## 2020-09-26 DIAGNOSIS — M545 Low back pain, unspecified: Secondary | ICD-10-CM | POA: Insufficient documentation

## 2020-09-26 DIAGNOSIS — Z5321 Procedure and treatment not carried out due to patient leaving prior to being seen by health care provider: Secondary | ICD-10-CM | POA: Insufficient documentation

## 2020-09-26 NOTE — ED Triage Notes (Signed)
Pt arrives POV for eval of lower back pain x weeks. Reports he also wants a covid test d/t exposure. Pt is asymptomatic

## 2020-09-27 NOTE — ED Notes (Signed)
No answer for VS x3 

## 2021-05-19 ENCOUNTER — Ambulatory Visit (HOSPITAL_COMMUNITY)
Admission: EM | Admit: 2021-05-19 | Discharge: 2021-05-19 | Disposition: A | Discharge status: Home / Self Care | Payer: PRIVATE HEALTH INSURANCE | Attending: Emergency Medicine | Admitting: Emergency Medicine

## 2021-05-19 ENCOUNTER — Other Ambulatory Visit: Payer: Self-pay

## 2021-05-19 ENCOUNTER — Encounter (HOSPITAL_COMMUNITY): Payer: Self-pay | Admitting: Emergency Medicine

## 2021-05-19 DIAGNOSIS — Z113 Encounter for screening for infections with a predominantly sexual mode of transmission: Secondary | ICD-10-CM | POA: Insufficient documentation

## 2021-05-19 LAB — HIV ANTIBODY (ROUTINE TESTING W REFLEX): HIV Screen 4th Generation wRfx: NONREACTIVE

## 2021-05-19 NOTE — ED Triage Notes (Signed)
Pt presents for STD testing. Denies symptoms. States girlfriend tested positive for BV and yeast infection.

## 2021-05-19 NOTE — Discharge Instructions (Signed)
Labs pending 2-3 days, you will be contacted if positive for any sti and treatment will be sent to the pharmacy, you will have to return to the clinic if positive for gonorrhea to receive treatment   Please refrain from having sex until labs results, if positive please refrain from having sex until treatment complete and symptoms resolve   If positive for HIV, Syphilis, Chlamydia  gonorrhea or trichomoniasis please notify partner or partners so they may tested as well  Moving forward, it is recommended you use some form of protection against the transmission of sti infections  such as condoms or dental dams with each sexual encounter   
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
## 2021-05-20 LAB — CYTOLOGY, (ORAL, ANAL, URETHRAL) ANCILLARY ONLY
Chlamydia: NEGATIVE
Comment: NEGATIVE
Comment: NEGATIVE
Comment: NORMAL
Neisseria Gonorrhea: NEGATIVE
Trichomonas: NEGATIVE

## 2021-05-20 LAB — RPR: RPR Ser Ql: NONREACTIVE

## 2022-02-18 ENCOUNTER — Other Ambulatory Visit: Payer: Self-pay

## 2022-02-18 ENCOUNTER — Encounter (HOSPITAL_COMMUNITY): Payer: Self-pay

## 2022-02-18 ENCOUNTER — Emergency Department (HOSPITAL_COMMUNITY)
Admission: EM | Admit: 2022-02-18 | Discharge: 2022-02-18 | Disposition: A | Discharge status: Home / Self Care | Payer: Self-pay | Attending: Emergency Medicine | Admitting: Emergency Medicine

## 2022-02-18 ENCOUNTER — Emergency Department (HOSPITAL_COMMUNITY): Payer: Self-pay

## 2022-02-18 DIAGNOSIS — R0602 Shortness of breath: Secondary | ICD-10-CM | POA: Insufficient documentation

## 2022-02-18 DIAGNOSIS — M542 Cervicalgia: Secondary | ICD-10-CM | POA: Insufficient documentation

## 2022-02-18 DIAGNOSIS — R079 Chest pain, unspecified: Secondary | ICD-10-CM | POA: Insufficient documentation

## 2022-02-18 DIAGNOSIS — Z79899 Other long term (current) drug therapy: Secondary | ICD-10-CM | POA: Insufficient documentation

## 2022-02-18 DIAGNOSIS — R200 Anesthesia of skin: Secondary | ICD-10-CM | POA: Insufficient documentation

## 2022-02-18 DIAGNOSIS — I10 Essential (primary) hypertension: Secondary | ICD-10-CM | POA: Insufficient documentation

## 2022-02-18 DIAGNOSIS — R112 Nausea with vomiting, unspecified: Secondary | ICD-10-CM | POA: Insufficient documentation

## 2022-02-18 DIAGNOSIS — R030 Elevated blood-pressure reading, without diagnosis of hypertension: Secondary | ICD-10-CM

## 2022-02-18 LAB — BASIC METABOLIC PANEL
Anion gap: 7 (ref 5–15)
BUN: 10 mg/dL (ref 6–20)
CO2: 25 mmol/L (ref 22–32)
Calcium: 9.6 mg/dL (ref 8.9–10.3)
Chloride: 110 mmol/L (ref 98–111)
Creatinine, Ser: 1.09 mg/dL (ref 0.61–1.24)
GFR, Estimated: >60 mL/min (ref >60)
Glucose, Bld: 95 mg/dL (ref 70–99)
Potassium: 4.1 mmol/L (ref 3.5–5.1)
Sodium: 142 mmol/L (ref 135–145)

## 2022-02-18 LAB — CBC
HCT: 41.7 % (ref 39.0–52.0)
Hemoglobin: 14.4 g/dL (ref 13.0–17.0)
MCH: 34 pg (ref 26.0–34.0)
MCHC: 34.5 g/dL (ref 30.0–36.0)
MCV: 98.3 fL (ref 80.0–100.0)
Platelets: 299 10*3/uL (ref 150–400)
RBC: 4.24 MIL/uL (ref 4.22–5.81)
RDW: 12.2 % (ref 11.5–15.5)
WBC: 10.4 10*3/uL (ref 4.0–10.5)
nRBC: 0 % (ref 0.0–0.2)

## 2022-02-18 LAB — TROPONIN I (HIGH SENSITIVITY): Troponin I (High Sensitivity): 13 ng/L (ref <18)

## 2022-02-18 MED ORDER — AMLODIPINE BESYLATE 5 MG PO TABS: 5.0000 mg | ORAL_TABLET | Freq: Every day | ORAL | 0 refills | Status: DC

## 2022-02-18 NOTE — Discharge Instructions (Addendum)
Your work-up today was overall reassuring.  Your blood pressure was elevated today, I have sent a prescription for your blood pressure medicine and please start taking this again.  The symptoms you described today could be possibly due to gastric reflux, you may take over-the-counter medicine such as Pepcid, Prilosec, or other medications that can be found at your local pharmacy see if this helps her symptoms.  You may also take 800 mg of ibuprofen up to 3 times daily for a week for your neck pain.  I also recommend that you follow-up with [facility] for further evaluation of your suspected left-sided radiculopathy.  Please call the phone number provided and schedule an appointment.  Please make sure to follow-up with [facility] which is a free clinic in the area and schedule an appointment for primary care follow-up.  Please return to the ER for any new or worsening symptoms.
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
## 2022-02-18 NOTE — ED Triage Notes (Signed)
Patient has hx of htn irregular heart beat and is noncompliant with meds.  Complains of CP SOB for "awhile noW".  Reports it is associated with n/v dizziness sweating left arm pain.  Also complains of neck injury that is also contributihng to pain. Complains of pain with deep breaths

## 2023-11-06 ENCOUNTER — Emergency Department (HOSPITAL_COMMUNITY): Payer: MEDICAID

## 2023-11-06 ENCOUNTER — Other Ambulatory Visit: Payer: Self-pay

## 2023-11-06 ENCOUNTER — Inpatient Hospital Stay (HOSPITAL_COMMUNITY)
Admission: EM | Admit: 2023-11-06 | Discharge: 2023-11-08 | DRG: 683 | Discharge status: Against Medical Advice | Payer: MEDICAID | Attending: Internal Medicine | Admitting: Internal Medicine

## 2023-11-06 DIAGNOSIS — Z885 Allergy status to narcotic agent status: Secondary | ICD-10-CM | POA: Diagnosis not present

## 2023-11-06 DIAGNOSIS — J42 Unspecified chronic bronchitis: Secondary | ICD-10-CM | POA: Diagnosis present

## 2023-11-06 DIAGNOSIS — E860 Dehydration: Secondary | ICD-10-CM | POA: Diagnosis present

## 2023-11-06 DIAGNOSIS — F4310 Post-traumatic stress disorder, unspecified: Secondary | ICD-10-CM | POA: Diagnosis present

## 2023-11-06 DIAGNOSIS — N179 Acute kidney failure, unspecified: Secondary | ICD-10-CM | POA: Diagnosis not present

## 2023-11-06 DIAGNOSIS — Z981 Arthrodesis status: Secondary | ICD-10-CM

## 2023-11-06 DIAGNOSIS — R112 Nausea with vomiting, unspecified: Secondary | ICD-10-CM

## 2023-11-06 DIAGNOSIS — Z5329 Procedure and treatment not carried out because of patient's decision for other reasons: Secondary | ICD-10-CM | POA: Diagnosis present

## 2023-11-06 DIAGNOSIS — N10 Acute tubulo-interstitial nephritis: Secondary | ICD-10-CM | POA: Diagnosis present

## 2023-11-06 DIAGNOSIS — F17210 Nicotine dependence, cigarettes, uncomplicated: Secondary | ICD-10-CM | POA: Diagnosis present

## 2023-11-06 DIAGNOSIS — F419 Anxiety disorder, unspecified: Secondary | ICD-10-CM | POA: Diagnosis present

## 2023-11-06 DIAGNOSIS — N170 Acute kidney failure with tubular necrosis: Secondary | ICD-10-CM | POA: Diagnosis present

## 2023-11-06 DIAGNOSIS — N452 Orchitis: Secondary | ICD-10-CM | POA: Diagnosis present

## 2023-11-06 DIAGNOSIS — I10 Essential (primary) hypertension: Secondary | ICD-10-CM | POA: Diagnosis present

## 2023-11-06 DIAGNOSIS — M6282 Rhabdomyolysis: Secondary | ICD-10-CM | POA: Diagnosis present

## 2023-11-06 DIAGNOSIS — Z1152 Encounter for screening for COVID-19: Secondary | ICD-10-CM | POA: Diagnosis not present

## 2023-11-06 DIAGNOSIS — R45851 Suicidal ideations: Secondary | ICD-10-CM | POA: Diagnosis present

## 2023-11-06 DIAGNOSIS — Z8249 Family history of ischemic heart disease and other diseases of the circulatory system: Secondary | ICD-10-CM | POA: Diagnosis not present

## 2023-11-06 DIAGNOSIS — J101 Influenza due to other identified influenza virus with other respiratory manifestations: Secondary | ICD-10-CM | POA: Diagnosis present

## 2023-11-06 DIAGNOSIS — Z9049 Acquired absence of other specified parts of digestive tract: Secondary | ICD-10-CM

## 2023-11-06 DIAGNOSIS — Z79899 Other long term (current) drug therapy: Secondary | ICD-10-CM

## 2023-11-06 DIAGNOSIS — F329 Major depressive disorder, single episode, unspecified: Secondary | ICD-10-CM | POA: Diagnosis present

## 2023-11-06 LAB — MAGNESIUM: Magnesium: 2 mg/dL (ref 1.7–2.4)

## 2023-11-06 LAB — CBC
HCT: 42.9 % (ref 39.0–52.0)
Hemoglobin: 14.5 g/dL (ref 13.0–17.0)
MCH: 32.7 pg (ref 26.0–34.0)
MCHC: 33.8 g/dL (ref 30.0–36.0)
MCV: 96.8 fL (ref 80.0–100.0)
Platelets: 285 10*3/uL (ref 150–400)
RBC: 4.43 MIL/uL (ref 4.22–5.81)
RDW: 12.5 % (ref 11.5–15.5)
WBC: 7.9 10*3/uL (ref 4.0–10.5)
nRBC: 0 % (ref 0.0–0.2)

## 2023-11-06 LAB — COMPREHENSIVE METABOLIC PANEL
ALT: 21 U/L (ref 0–44)
AST: 31 U/L (ref 15–41)
Albumin: 4.2 g/dL (ref 3.5–5.0)
Alkaline Phosphatase: 59 U/L (ref 38–126)
Anion gap: 17 — ABNORMAL HIGH (ref 5–15)
BUN: 32 mg/dL — ABNORMAL HIGH (ref 6–20)
CO2: 20 mmol/L — ABNORMAL LOW (ref 22–32)
Calcium: 9.3 mg/dL (ref 8.9–10.3)
Chloride: 101 mmol/L (ref 98–111)
Creatinine, Ser: 6.9 mg/dL — ABNORMAL HIGH (ref 0.61–1.24)
GFR, Estimated: 10 mL/min — ABNORMAL LOW (ref >60)
Glucose, Bld: 117 mg/dL — ABNORMAL HIGH (ref 70–99)
Potassium: 3.7 mmol/L (ref 3.5–5.1)
Sodium: 138 mmol/L (ref 135–145)
Total Bilirubin: 0.5 mg/dL (ref 0.0–1.2)
Total Protein: 7.1 g/dL (ref 6.5–8.1)

## 2023-11-06 LAB — LIPASE, BLOOD: Lipase: 34 U/L (ref 11–51)

## 2023-11-06 LAB — URINALYSIS, ROUTINE W REFLEX MICROSCOPIC
Bacteria, UA: NONE SEEN
Bilirubin Urine: NEGATIVE
Glucose, UA: NEGATIVE mg/dL
Ketones, ur: NEGATIVE mg/dL
Leukocytes,Ua: NEGATIVE
Nitrite: NEGATIVE
Protein, ur: 100 mg/dL — AB
Specific Gravity, Urine: 1.012 (ref 1.005–1.030)
pH: 5 (ref 5.0–8.0)

## 2023-11-06 LAB — RESP PANEL BY RT-PCR (RSV, FLU A&B, COVID)  RVPGX2
Influenza A by PCR: POSITIVE — AB
Influenza B by PCR: NEGATIVE
Resp Syncytial Virus by PCR: NEGATIVE
SARS Coronavirus 2 by RT PCR: NEGATIVE

## 2023-11-06 LAB — BASIC METABOLIC PANEL
Anion gap: 11 (ref 5–15)
BUN: 32 mg/dL — ABNORMAL HIGH (ref 6–20)
CO2: 24 mmol/L (ref 22–32)
Calcium: 8.7 mg/dL — ABNORMAL LOW (ref 8.9–10.3)
Chloride: 103 mmol/L (ref 98–111)
Creatinine, Ser: 7.14 mg/dL — ABNORMAL HIGH (ref 0.61–1.24)
GFR, Estimated: 10 mL/min — ABNORMAL LOW (ref >60)
Glucose, Bld: 106 mg/dL — ABNORMAL HIGH (ref 70–99)
Potassium: 3.7 mmol/L (ref 3.5–5.1)
Sodium: 138 mmol/L (ref 135–145)

## 2023-11-06 LAB — HIV ANTIBODY (ROUTINE TESTING W REFLEX): HIV Screen 4th Generation wRfx: NONREACTIVE

## 2023-11-06 LAB — PROTEIN / CREATININE RATIO, URINE
Creatinine, Urine: 163 mg/dL
Protein Creatinine Ratio: 1.89 mg/mg{creat} — ABNORMAL HIGH (ref 0.00–0.15)
Total Protein, Urine: 308 mg/dL

## 2023-11-06 LAB — CK: Total CK: 1408 U/L — ABNORMAL HIGH (ref 49–397)

## 2023-11-06 MED ORDER — ONDANSETRON HCL 4 MG PO TABS: 4.0000 mg | ORAL_TABLET | Freq: Four times a day (QID) | ORAL | Status: DC | PRN

## 2023-11-06 MED ORDER — LACTATED RINGERS IV SOLN: INTRAVENOUS | Status: AC

## 2023-11-06 MED ORDER — SIMETHICONE 80 MG PO CHEW
80.0000 mg | CHEWABLE_TABLET | Freq: Four times a day (QID) | ORAL | Status: DC | PRN
  Administered 2023-11-06 – 2023-11-07 (×4): 80 mg via ORAL
  Filled 2023-11-06 (×5): qty 1

## 2023-11-06 MED ORDER — SODIUM CHLORIDE 0.9 % IV SOLN
1.0000 g | Freq: Once | INTRAVENOUS | Status: AC
  Administered 2023-11-06: 1 g via INTRAVENOUS
  Filled 2023-11-06: qty 10

## 2023-11-06 MED ORDER — CEFTRIAXONE SODIUM 1 G IJ SOLR
500.0000 mg | Freq: Once | INTRAMUSCULAR | Status: DC
  Filled 2023-11-06: qty 10

## 2023-11-06 MED ORDER — ONDANSETRON HCL 4 MG/2ML IJ SOLN
4.0000 mg | Freq: Four times a day (QID) | INTRAMUSCULAR | Status: DC | PRN
  Administered 2023-11-07 – 2023-11-08 (×5): 4 mg via INTRAVENOUS
  Filled 2023-11-06 (×5): qty 2

## 2023-11-06 MED ORDER — SODIUM CHLORIDE 0.9 % IV BOLUS
1000.0000 mL | Freq: Once | INTRAVENOUS | Status: AC
  Administered 2023-11-06: 1000 mL via INTRAVENOUS

## 2023-11-06 MED ORDER — HYDROCODONE-ACETAMINOPHEN 10-325 MG PO TABS
1.0000 | ORAL_TABLET | ORAL | Status: DC | PRN
  Administered 2023-11-06: 1 via ORAL
  Administered 2023-11-06: 2 via ORAL
  Administered 2023-11-07: 1 via ORAL
  Filled 2023-11-06: qty 2
  Filled 2023-11-06 (×2): qty 1

## 2023-11-06 MED ORDER — HEPARIN SODIUM (PORCINE) 5000 UNIT/ML IJ SOLN
5000.0000 [IU] | Freq: Three times a day (TID) | INTRAMUSCULAR | Status: DC
  Administered 2023-11-06 – 2023-11-07 (×3): 5000 [IU] via SUBCUTANEOUS
  Filled 2023-11-06 (×5): qty 1

## 2023-11-06 MED ORDER — ACETAMINOPHEN 325 MG PO TABS
650.0000 mg | ORAL_TABLET | Freq: Four times a day (QID) | ORAL | Status: DC | PRN
  Administered 2023-11-07 – 2023-11-08 (×2): 650 mg via ORAL
  Filled 2023-11-06 (×2): qty 2

## 2023-11-06 MED ORDER — DOXYCYCLINE HYCLATE 100 MG PO TABS
100.0000 mg | ORAL_TABLET | Freq: Two times a day (BID) | ORAL | Status: DC
  Administered 2023-11-06 – 2023-11-08 (×5): 100 mg via ORAL
  Filled 2023-11-06 (×5): qty 1

## 2023-11-06 MED ORDER — ALBUTEROL SULFATE (2.5 MG/3ML) 0.083% IN NEBU: 2.5000 mg | INHALATION_SOLUTION | RESPIRATORY_TRACT | Status: DC | PRN

## 2023-11-06 MED ORDER — MORPHINE SULFATE (PF) 2 MG/ML IV SOLN
1.0000 mg | INTRAVENOUS | Status: DC | PRN
  Administered 2023-11-06 – 2023-11-08 (×3): 1 mg via INTRAVENOUS
  Filled 2023-11-06 (×3): qty 1

## 2023-11-06 MED ORDER — HYDROCODONE-ACETAMINOPHEN 5-325 MG PO TABS
1.0000 | ORAL_TABLET | Freq: Once | ORAL | Status: AC
  Administered 2023-11-06: 1 via ORAL
  Filled 2023-11-06: qty 1

## 2023-11-06 MED ORDER — ACETAMINOPHEN 650 MG RE SUPP
650.0000 mg | Freq: Four times a day (QID) | RECTAL | Status: DC | PRN
Start: 2023-11-06 — End: 2023-11-08

## 2023-11-06 MED ORDER — OSELTAMIVIR PHOSPHATE 30 MG PO CAPS
30.0000 mg | ORAL_CAPSULE | Freq: Every day | ORAL | Status: DC
  Administered 2023-11-06 – 2023-11-08 (×3): 30 mg via ORAL
  Filled 2023-11-06 (×4): qty 1

## 2023-11-06 MED ORDER — ONDANSETRON HCL 4 MG/2ML IJ SOLN
4.0000 mg | Freq: Once | INTRAMUSCULAR | Status: AC
  Administered 2023-11-06: 4 mg via INTRAVENOUS
  Filled 2023-11-06: qty 2

## 2023-11-06 NOTE — Plan of Care (Signed)
  Problem: Education: Goal: Knowledge of General Education information will improve Description: Including pain rating scale, medication(s)/side effects and non-pharmacologic comfort measures Outcome: Progressing   Problem: Health Behavior/Discharge Planning: Goal: Ability to manage health-related needs will improve Outcome: Progressing   Problem: Health Behavior/Discharge Planning: Goal: Ability to manage health-related needs will improve Outcome: Progressing   Problem: Clinical Measurements: Goal: Ability to maintain clinical measurements within normal limits will improve Outcome: Progressing   Problem: Clinical Measurements: Goal: Ability to maintain clinical measurements within normal limits will improve Outcome: Progressing Goal: Will remain free from infection Outcome: Progressing Goal: Diagnostic test results will improve Outcome: Progressing Goal: Respiratory complications will improve Outcome: Progressing Goal: Cardiovascular complication will be avoided Outcome: Progressing   Problem: Activity: Goal: Risk for activity intolerance will decrease Outcome: Progressing   Problem: Nutrition: Goal: Adequate nutrition will be maintained Outcome: Progressing   Problem: Coping: Goal: Level of anxiety will decrease Outcome: Progressing   Problem: Elimination: Goal: Will not experience complications related to bowel motility Outcome: Progressing Goal: Will not experience complications related to urinary retention Outcome: Progressing   Problem: Pain Managment: Goal: General experience of comfort will improve and/or be controlled Outcome: Progressing

## 2023-11-06 NOTE — ED Provider Notes (Signed)
 [facility] EMERGENC[facility] DEPARTMENT AT [facility] Provider Note   CSN: [id number] Arrival date & time: 11/06/23  7829     History  No chief complaint on file.   Derek Sullivan is a 33 y.o. male.  HPI   33 year old male presents emergency department complaints of abdominal pain, nausea, vomiting, diarrhea, left-sided testicular pain.  Reports history of symptoms beginning 4 days ago.  States that he was in the hospital with his mother prior to symptom onset and feels like he caught something in the hospital.  Reports pain in the left lower abdomen without radiation.  Reports a couple episodes of very streaky red emesis that has not been persistent.  States that as he has vomited more, has developed some chest discomfort.  States that he feels tight in his if he has secondary bronchitis.  Denies any blood thinner use.  Denies any urinary symptoms, penile discharge, rash.  Past medical history significant for chronic bronchitis, hypertension, PTSD, migraine, hypertension  Home Medications Prior to Admission medications   Medication Sig Start Date End Date Taking? Authorizing Provider  amLODipine (NORVASC) 5 MG tablet Take 1 tablet (5 mg total) by mouth daily. 02/18/22   Mare Ferrari, PA-C  DULoxetine (CYMBALTA) 30 MG capsule Take 1 capsule (30 mg total) by mouth daily. 05/04/18   Laveda Abbe, NP  gabapentin (NEURONTIN) 100 MG capsule Take 1 capsule (100 mg total) by mouth 3 (three) times daily. 05/03/18   Laveda Abbe, NP  hydrOXYzine (ATARAX/VISTARIL) 50 MG tablet Take 1 tablet (50 mg total) by mouth 3 (three) times daily as needed for anxiety. 05/03/18   Laveda Abbe, NP  nicotine (NICODERM CQ - DOSED IN MG/24 HOURS) 21 mg/24hr patch Place 1 patch (21 mg total) onto the skin daily. 05/04/18   Laveda Abbe, NP  traZODone (DESYREL) 50 MG tablet Take 1 tablet (50 mg total) by mouth at bedtime as needed for sleep. 05/03/18   Laveda Abbe,  NP      Allergies    Oxycodone and Oxycodone    Review of Systems   Review of Systems  All other systems reviewed and are negative.   Physical Exam Updated Vital Signs BP (!) 140/90 (BP Location: Left Arm)   Pulse 95   Temp 98.3 F (36.8 C) (Oral)   Resp 20   Ht 6\' 5"  (1.956 m)   Wt 131.5 kg   SpO2 96%   BMI 34.39 kg/m  Physical Exam Vitals and nursing note reviewed.  Constitutional:      General: He is not in acute distress.    Appearance: He is well-developed.  HENT:     Head: Normocephalic and atraumatic.  Eyes:     Conjunctiva/sclera: Conjunctivae normal.  Cardiovascular:     Rate and Rhythm: Normal rate and regular rhythm.     Heart sounds: No murmur heard. Pulmonary:     Effort: Pulmonary effort is normal. No respiratory distress.     Comments: Faint expiratory wheeze appreciated bilateral lung fields. Abdominal:     Palpations: Abdomen is soft.     Tenderness: There is abdominal tenderness in the left lower quadrant. There is no right CVA tenderness or left CVA tenderness.  Genitourinary:    Comments: Testicular exam overall unremarkable.  No obvious testicular mass.  Testicles vertical lying.  Cremasteric reflex intact bilaterally.  Phren's sign negative.  No obvious mass palpated in the inguinal area with Valsalva.  No overlying skin changes such  as erythema, fluctuance/induration.  No epididymal tenderness.  Diffuse mild tenderness left testicle. Musculoskeletal:        General: No swelling.     Cervical back: Neck supple.     Right lower leg: No edema.     Left lower leg: No edema.  Skin:    General: Skin is warm and dry.     Capillary Refill: Capillary refill takes less than 2 seconds.  Neurological:     Mental Status: He is alert.  Psychiatric:        Mood and Affect: Mood normal.     ED Results / Procedures / Treatments   Labs (all labs ordered are listed, but only abnormal results are displayed) Labs Reviewed  RESP PANEL BY RT-PCR (RSV, FLU  A&B, COVID)  RVPGX2 - Abnormal; Notable for the following components:      Result Value   Influenza A by PCR POSITIVE (*)    All other components within normal limits  COMPREHENSIVE METABOLIC PANEL - Abnormal; Notable for the following components:   CO2 20 (*)    Glucose, Bld 117 (*)    BUN 32 (*)    Creatinine, Ser 6.90 (*)    GFR, Estimated 10 (*)    Anion gap 17 (*)    All other components within normal limits  URINALYSIS, ROUTINE W REFLEX MICROSCOPIC - Abnormal; Notable for the following components:   Color, Urine STRAW (*)    Hgb urine dipstick SMALL (*)    Protein, ur 100 (*)    All other components within normal limits  CK - Abnormal; Notable for the following components:   Total CK 1,408 (*)    All other components within normal limits  URINE CULTURE  CBC  LIPASE, BLOOD  MAGNESIUM  HIV ANTIBODY (ROUTINE TESTING W REFLEX)  PROTEIN / CREATININE RATIO, URINE  GC/CHLAMYDIA PROBE AMP ([place]) NOT AT [facility]    EKG None  Radiology CT ABDOMEN PELVIS WO CONTRAST Result Date: 11/06/2023 CLINICAL DATA:  Abdominal pain EXAM: CT ABDOMEN AND PELVIS WITHOUT CONTRAST TECHNIQUE: Multidetector CT imaging of the abdomen and pelvis was performed following the standard protocol without IV contrast. RADIATION DOSE REDUCTION: This exam was performed according to the departmental dose-optimization program which includes automated exposure control, adjustment of the mA and/or kV according to patient size and/or use of iterative reconstruction technique. COMPARISON:  04/24/2015. FINDINGS: Lower chest: Clear.  No pericardial or pleural effusions. Hepatobiliary: No focal liver abnormality is seen. No gallstones, gallbladder wall thickening, or biliary dilatation. Pancreas: Unremarkable. No pancreatic ductal dilatation or surrounding inflammatory changes. Spleen: Normal in size without focal abnormality. Adrenals/Urinary Tract: Adrenal glands are unremarkable. Mild perinephric fat stranding, right  more severe than the. This may indicate pyelonephritis and correlation with urinalysis recommended. No hydronephrosis or nephrolithiasis. Unremarkable urinary bladder. Stomach/Bowel: Stomach is within normal limits. Appendix appears to have been removed and no evidence of appendicitis. No evidence of bowel wall thickening, distention, or inflammatory changes. Vascular/Lymphatic: No significant vascular findings are present. No enlarged abdominal or pelvic lymph nodes. Reproductive: Prostate is unremarkable. Other: No abdominal wall hernia or abnormality. No abdominopelvic ascites. Musculoskeletal: No acute or significant osseous findings. IMPRESSION: 1. Perinephric fat stranding, right more severe than the left. This may indicate pyelonephritis and correlation with urinalysis recommended. 2. Otherwise no acute abdominal or pelvic pathology identified. Electronically Signed   By: Layla Maw M.D.   On: 11/06/2023 12:55   US SCROTUM W/DOPPLER Result Date: 11/06/2023 CLINICAL DATA:  pain left  testicle for 4 days EXAM: SCROTAL ULTRASOUND DOPPLER ULTRASOUND OF THE TESTICLES TECHNIQUE: Complete ultrasound examination of the testicles, epididymis, and other scrotal structures was performed. Color and spectral Doppler ultrasound were also utilized to evaluate blood flow to the testicles. COMPARISON:  None Available. FINDINGS: Evaluation is limited by patient motion. Right testicle Measurements: 3.7 x 2.3 x 3.1 cm. No mass or microlithiasis visualized. Left testicle Measurements: 4.0 x 2.1 x 2.6 cm. No mass or microlithiasis visualized. Mildly heterogeneous in appearance with suggestion of underlying edema. Right epididymis:  Normal in size and appearance. Left epididymis:  Normal in size and appearance. Hydrocele:  Trace bilateral peritesticular hydroceles. Varicocele:  None visualized. Pulsed Doppler interrogation of both testes demonstrates normal low resistance arterial and venous waveforms bilaterally.  IMPRESSION: 1. Evaluation is limited by patient motion. Within this limitation, there is mild heterogeneity of the left testicle with suggestion of underlying edema. This is nonspecific but could reflect orchitis. 2. No sonographic evidence of testicular torsion. Electronically Signed   By: Meda Klinefelter M.D.   On: 11/06/2023 11:33   DG Chest Port 1 View Result Date: 11/06/2023 CLINICAL DATA:  Chest pain. EXAM: PORTABLE CHEST 1 VIEW COMPARISON:  02/18/2022 FINDINGS: Heart size and mediastinal contours are normal. No pleural fluid, interstitial edema, or airspace consolidation. Postoperative changes in the lower cervical spine. Visualized osseous structures appear intact. IMPRESSION: No acute cardiopulmonary disease. Electronically Signed   By: Signa Kell M.D.   On: 11/06/2023 11:15    Procedures .Critical Care  Performed by: Peter Garter, PA Authorized by: Peter Garter, PA   Critical care provider statement:    Critical care time (minutes):  49   Critical care was necessary to treat or prevent imminent or life-threatening deterioration of the following conditions:  Renal failure   Critical care was time spent personally by me on the following activities:  Development of treatment plan with patient or surrogate, discussions with consultants, evaluation of patient's response to treatment, examination of patient, ordering and review of laboratory studies, ordering and review of radiographic studies, ordering and performing treatments and interventions, pulse oximetry, re-evaluation of patient's condition and review of old charts   I assumed direction of critical care for this patient from another provider in my specialty: no     Care discussed with: admitting provider       Medications Ordered in ED Medications  cefTRIAXone (ROCEPHIN) injection 500 mg (has no administration in time range)  doxycycline (VIBRA-TABS) tablet 100 mg (has no administration in time range)  heparin  injection 5,000 Units (has no administration in time range)  lactated ringers infusion (has no administration in time range)  acetaminophen (TYLENOL) tablet 650 mg (has no administration in time range)    Or  acetaminophen (TYLENOL) suppository 650 mg (has no administration in time range)  ondansetron (ZOFRAN) tablet 4 mg (has no administration in time range)    Or  ondansetron (ZOFRAN) injection 4 mg (has no administration in time range)  albuterol (PROVENTIL) (2.5 MG/3ML) 0.083% nebulizer solution 2.5 mg (has no administration in time range)  oseltamivir (TAMIFLU) capsule 30 mg (has no administration in time range)  morphine (PF) 2 MG/ML injection 1 mg (has no administration in time range)  ondansetron (ZOFRAN) injection 4 mg (4 mg Intravenous Given 11/06/23 1028)  sodium chloride 0.9 % bolus 1,000 mL (0 mLs Intravenous Stopped 11/06/23 1144)  HYDROcodone-acetaminophen (NORCO/VICODIN) 5-325 MG per tablet 1 tablet (1 tablet Oral Given 11/06/23 1131)  sodium chloride 0.9 %  bolus 1,000 mL (1,000 mLs Intravenous New Bag/Given 11/06/23 1143)    ED Course/ Medical Decision Making/ A&P Clinical Course as of 11/06/23 1407  Sat Nov 06, 2023  1403 Consulted hospitalist Dr. Erenest Blank who agreed with admission and assume further treatment/care. [CR]    Clinical Course User Index [CR] Peter Garter, PA                                 Medical Decision Making Amount and/or Complexity of Data Reviewed Labs: ordered. Radiology: ordered.  Risk Prescription drug management. Decision regarding hospitalization.   This patient presents to the ED for concern of abdominal pain, nausea, vomiting, cough, congestion, this involves an extensive number of treatment options, and is a complaint that carries with it a high risk of complications and morbidity.  The differential diagnosis includes COVID, flu, RSV, pneumonia, viral gastroenteritis, especially so p.o., CBD pathology, cholecystitis, appendicitis,  diverticulitis, pyelonephritis, nephrolithiasis, other   Co morbidities that complicate the patient evaluation  See HPI   Additional history obtained:  Additional history obtained from EMR External records from outside source obtained and reviewed including hospital records   Lab Tests:  I Ordered, and personally interpreted labs.  The pertinent results include: No leukocytosis.  No evidence of anemia.  Platelets within range.  Decrease in bicarb of 20 otherwise, electrolytes within normal limits.  Patient with significantly worsening renal function creatinine 6.9, BUN of 32, GFR of 10 from baseline creatinine around 1 end of May of last year.  Anion gap of 17.  UA with 6-10 WBCs, proteins, small hemoglobin otherwise unremarkable.  Viral panel testing positive for influenza A.  CK at 1408.  Lipase normal.   Imaging Studies ordered:  I ordered imaging studies including chest ray, testicular ultrasound, CT abdomen pelvis I independently visualized and interpreted imaging which showed  Chest x-ray: No acute cardiopulmonary abnormality. Testicular ultrasound: Possible left-sided orchitis. CT abdomen pelvis: Perinephric stranding right greater than left.  No acute abdominal or pelvic pathology. I agree with the radiologist interpretation  Cardiac Monitoring: / EKG:  The patient was maintained on a cardiac monitor.  I personally viewed and interpreted the cardiac monitored which showed an underlying rhythm of: Sinus rhythm   Consultations Obtained:  See ED course  Problem List / ED Course / Critical interventions / Medication management  Influenza A, AKI, nausea, vomiting, diarrhea, orchitis I ordered medication including 2 L normal saline, Norco, Zofran   Reevaluation of the patient after these medicines showed that the patient improved I have reviewed the patients home medicines and have made adjustments as needed   Social Determinants of Health:  Cigarette use.  Denies  illicit drug use.   Test / Admission - Considered:  Influenza A, AKI, nausea, vomiting, diarrhea, orchitis Vitals signs significant for hypertension blood pressure 169/114 oh which improved to 140/90. Otherwise within normal range and stable throughout visit. Laboratory/imaging studies significant for: See above 33 year old male presents emergency department with complaints of nausea, vomiting, diarrhea, abdominal pain, cough, congestion.  Symptoms present for the past 3 to 4 days.  On exam, tenderness left lower abdomen with diffuse tenderness left testicle.  Otherwise physical exam benign.  Labs concerning for significant AKI creatinine of 6.9 which is up from a creatinine of around 1 in May of a year ago; most likely prerenal from dehydration from GI loss.  Ultrasound imaging of patient's testicles showed possible left-sided orchitis.  Obtain  GC/committee as well as UA.  UA without obvious infection; GC/committee pending at this time.  Regarding patient's left lower abdominal tenderness, CT imaging obtained which did show perinephric stranding left greater than right without obvious urothelial obstruction.  Given patient's significant AKI in the setting of GI loss most likely from influenza A, admission deemed most appropriate.  Given 2 L of fluid with pain and nausea control with medications administered while in the ED.  Consulted hospitalist who agreed with admission.         Final Clinical Impression(s) / ED Diagnoses Final diagnoses:  Influenza A  AKI (acute kidney injury) (HCC)  Orchitis  Nausea vomiting and diarrhea    Rx / DC Orders ED Discharge Orders     None         Peter Garter, [place] 11/06/23 1407    Gloris Manchester, MD 11/07/23 1601

## 2023-11-06 NOTE — ED Triage Notes (Signed)
 PT c/o of vomiting, diarrhea, fever, body aches, that has been going for 4 days.Has abd pain and left testicle pain as well. Experiencing sob, and chest pain.

## 2023-11-06 NOTE — ED Notes (Signed)
 Pt alert, NAD, calm, interactive, cooperative, follows commands, answers questions appropriately. Family at [facility]. Xray at [facility] at this time.
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
## 2023-11-07 ENCOUNTER — Inpatient Hospital Stay (HOSPITAL_COMMUNITY): Payer: MEDICAID

## 2023-11-07 LAB — CBC
HCT: 36.8 % — ABNORMAL LOW (ref 39.0–52.0)
Hemoglobin: 12.4 g/dL — ABNORMAL LOW (ref 13.0–17.0)
MCH: 33.2 pg (ref 26.0–34.0)
MCHC: 33.7 g/dL (ref 30.0–36.0)
MCV: 98.4 fL (ref 80.0–100.0)
Platelets: 236 10*3/uL (ref 150–400)
RBC: 3.74 MIL/uL — ABNORMAL LOW (ref 4.22–5.81)
RDW: 12.6 % (ref 11.5–15.5)
WBC: 7.9 10*3/uL (ref 4.0–10.5)
nRBC: 0 % (ref 0.0–0.2)

## 2023-11-07 LAB — BASIC METABOLIC PANEL
Anion gap: 11 (ref 5–15)
BUN: 33 mg/dL — ABNORMAL HIGH (ref 6–20)
CO2: 22 mmol/L (ref 22–32)
Calcium: 8.8 mg/dL — ABNORMAL LOW (ref 8.9–10.3)
Chloride: 105 mmol/L (ref 98–111)
Creatinine, Ser: 7.56 mg/dL — ABNORMAL HIGH (ref 0.61–1.24)
GFR, Estimated: 9 mL/min — ABNORMAL LOW (ref >60)
Glucose, Bld: 96 mg/dL (ref 70–99)
Potassium: 4.2 mmol/L (ref 3.5–5.1)
Sodium: 138 mmol/L (ref 135–145)

## 2023-11-07 LAB — URINE CULTURE: Culture: <10000 — AB

## 2023-11-07 LAB — RAPID HIV SCREEN (HIV 1/2 AB+AG)
HIV 1/2 Antibodies: NONREACTIVE
HIV-1 P24 Antigen - HIV24: NONREACTIVE

## 2023-11-07 LAB — CK: Total CK: 1119 U/L — ABNORMAL HIGH (ref 49–397)

## 2023-11-07 LAB — LIPASE, BLOOD: Lipase: 26 U/L (ref 11–51)

## 2023-11-07 MED ORDER — SODIUM CHLORIDE 0.9 % IV SOLN
2.0000 g | INTRAVENOUS | Status: DC
  Administered 2023-11-07 – 2023-11-08 (×2): 2 g via INTRAVENOUS
  Filled 2023-11-07 (×2): qty 20

## 2023-11-07 MED ORDER — GUAIFENESIN 100 MG/5ML PO LIQD: 5.0000 mL | ORAL | Status: DC | PRN

## 2023-11-07 MED ORDER — HYDRALAZINE HCL 20 MG/ML IJ SOLN
10.0000 mg | INTRAMUSCULAR | Status: DC | PRN
  Administered 2023-11-07 – 2023-11-08 (×3): 10 mg via INTRAVENOUS
  Filled 2023-11-07 (×4): qty 1

## 2023-11-07 MED ORDER — TRAZODONE HCL 50 MG PO TABS
50.0000 mg | ORAL_TABLET | Freq: Every evening | ORAL | Status: DC | PRN
  Administered 2023-11-07: 50 mg via ORAL
  Filled 2023-11-07: qty 1

## 2023-11-07 MED ORDER — METOPROLOL TARTRATE 5 MG/5ML IV SOLN: 5.0000 mg | INTRAVENOUS | Status: DC | PRN

## 2023-11-07 MED ORDER — SODIUM CHLORIDE 0.9 % IV SOLN: INTRAVENOUS | Status: AC

## 2023-11-07 MED ORDER — BISACODYL 5 MG PO TBEC
10.0000 mg | DELAYED_RELEASE_TABLET | Freq: Every day | ORAL | Status: DC
  Administered 2023-11-07: 10 mg via ORAL
  Filled 2023-11-07: qty 2

## 2023-11-07 MED ORDER — IPRATROPIUM-ALBUTEROL 0.5-2.5 (3) MG/3ML IN SOLN: 3.0000 mL | RESPIRATORY_TRACT | Status: DC | PRN

## 2023-11-07 MED ORDER — PANTOPRAZOLE SODIUM 40 MG PO TBEC
40.0000 mg | DELAYED_RELEASE_TABLET | Freq: Every day | ORAL | Status: DC
  Administered 2023-11-07 – 2023-11-08 (×2): 40 mg via ORAL
  Filled 2023-11-07 (×2): qty 1

## 2023-11-07 MED ORDER — FAMOTIDINE 20 MG PO TABS
20.0000 mg | ORAL_TABLET | Freq: Every day | ORAL | Status: DC
  Administered 2023-11-07 – 2023-11-08 (×2): 20 mg via ORAL
  Filled 2023-11-07 (×2): qty 1

## 2023-11-07 MED ORDER — HYDROCODONE-ACETAMINOPHEN 10-325 MG PO TABS
1.0000 | ORAL_TABLET | ORAL | Status: DC | PRN
  Administered 2023-11-07 – 2023-11-08 (×2): 1 via ORAL
  Filled 2023-11-07 (×2): qty 1

## 2023-11-07 MED ORDER — SENNOSIDES-DOCUSATE SODIUM 8.6-50 MG PO TABS
1.0000 | ORAL_TABLET | Freq: Every evening | ORAL | Status: DC | PRN
  Administered 2023-11-07: 1 via ORAL
  Filled 2023-11-07: qty 1

## 2023-11-07 NOTE — Hospital Course (Addendum)
 Brief Narrative:   33 year old with history of HTN, depression not on medication comes to the hospital with influenza A and acute renal failure.  Has been feeling sick for the past 4 days.  Upon admission noted to have AKI  Assessment & Plan:  Principal Problem:   AKI (acute kidney injury) (HCC)   Acute kidney injury Acute pyelonephritis - Baseline creatinine in 2023 was 1.0, admission creatinine 7.14, continues to trend up.  CT scan initially showed possible pyelonephritis, UA is negative.  Will repeated along with urine cultures.  Renal ultrasound is also unremarkable.  Due to rising creatinine, nephrology has been consulted.  Will continue aggressive IV fluids at this time.  If it fails to improve, we can proceed with further SPEP/ANA or even renal biopsy. - Currently patient is declining multiple therapies despite of understanding his condition.  His significant other is also present at bedside.  Mild rhabdomyolysis - IV fluids.  Trend CK levels.  Abd pain, unspecified -Lipase is normal, LFTs normal.  I explained this could be related to gastritis/ulcer.  He continues to decline and is difficult about taking his PPI/Carafate/Pepcid.  Influenza A - On Tamiflu, aggressive hydration  Left testicular pain - Declining any further antibiotics at this time.  Will discontinue it.  He understands that very well he could have underlying infection.  HIV is negative, GC chlamydia is pending.  DVT prophylaxis: SQ Heparin    Code Status: Full Code Family Communication:   Status is: Inpatient Remains inpatient appropriate because: Cont hosp stay for AKI    Subjective: Met with patient and his girlfriend at bedside.  He is very resistant to multiple treatment asking multiple questions and very upset that he does not want to take medications but at the same time once his abdominal pain/stomach ache to go away.  Continue to decline his care intermittently at this time.  He understands his blood  pressure is running high as well.  Declining subcutaneous heparin.  Patient's RN is in the room with me.  Examination:  General exam: Appears calm and comfortable  Respiratory system: Clear to auscultation. Respiratory effort normal. Cardiovascular system: S1 & S2 heard, RRR. No JVD, murmurs, rubs, gallops or clicks. No pedal edema. Gastrointestinal system: Abdomen is nondistended, soft and nontender. No organomegaly or masses felt. Normal bowel sounds heard. Central nervous system: Alert and oriented. No focal neurological deficits. Extremities: Symmetric 5 x 5 power. Skin: No rashes, lesions or ulcers Psychiatry: Judgement and insight appear normal. Mood & affect appropriate.

## 2023-11-07 NOTE — Progress Notes (Signed)
 PHARMACY NOTE:  RENAL DOSAGE ADJUSTMENT  Renal Function: Estimated Creatinine Clearance: 20.9 mL/min (A) (by C-G formula based on SCr of 7.56 mg/dL (H)).   Pepcid 20 bid reduced to 20 qday   Herby Abraham, Pharm.D Use secure chat for questions 11/07/2023 9:39 AM

## 2023-11-07 NOTE — Progress Notes (Signed)
 PROGRESS NOTE    Derek Sullivan  ZOX:[id number] DOB: [birth year] DOA: 11/06/2023 PCP: Patient, No Pcp Per    Brief Narrative:   33 year old with history of HTN, depression not on medication comes to the hospital with influenza A and acute renal failure.  Has been feeling sick for the past 4 days.  Upon admission noted to have AKI  Assessment & Plan:  Principal Problem:   AKI (acute kidney injury) (HCC)   Acute kidney injury Acute pyelonephritis - Baseline creatinine in 2023 was 1.0, admission creatinine 6.9 slowly trending up.  UA is negative, will bladder scan.  CT abdomen pelvis suggestive of possible pyelonephritis.  For now continue broad-spectrum antibiotics.  Aggressive IV fluids.  Low threshold to consult nephrology.  Renal ultrasound. - Strict input and output  Mild rhabdomyolysis - IV fluids.  Trend CK levels.  Abd pain -Check Lipase, PPI, Pepcid.   Influenza A - On Tamiflu, aggressive hydration  Left testicular pain -Concerns of possible orchitis.  Received a dose of Rocephin.  Continue doxycycline.  GC chlamydia ordered.  Will order HIV  DVT prophylaxis: SQ Heparin    Code Status: Full Code Family Communication:   Status is: Inpatient Remains inpatient appropriate because: Cont hosp stay for AKI    Subjective:  Sleeping, doesn't participate much in convo.  No new complaints besides some nonspecific abd pain. Not describing it properply.   Examination:  General exam: Appears calm and comfortable  Respiratory system: Clear to auscultation. Respiratory effort normal. Cardiovascular system: S1 & S2 heard, RRR. No JVD, murmurs, rubs, gallops or clicks. No pedal edema. Gastrointestinal system: Abdomen is nondistended, soft and nontender. No organomegaly or masses felt. Normal bowel sounds heard. Central nervous system: Alert and oriented. No focal neurological deficits. Extremities: Symmetric 5 x 5 power. Skin: No rashes, lesions or ulcers Psychiatry:  Judgement and insight appear normal. Mood & affect appropriate.                Diet Orders (From admission, onward)     Start     Ordered   11/06/23 1346  Diet regular Room service appropriate? Yes; Fluid consistency: Thin  Diet effective now       Question Answer Comment  Room service appropriate? Yes   Fluid consistency: Thin      11/06/23 1347            Objective: Vitals:   11/06/23 1318 11/06/23 1523 11/06/23 2046 11/07/23 0518  BP: (!) 140/90 (!) 181/148 (!) 177/116 (!) 158/105  Pulse: 95 83 75 75  Resp: 20 18 18 18   Temp: 98.3 F (36.8 C) 98 F (36.7 C) 98.6 F (37 C) 97.8 F (36.6 C)  TempSrc: Oral  Oral Oral  SpO2: 96% 100% 98% 100%  Weight:      Height:       No intake or output data in the 24 hours ending 11/07/23 0951 Filed Weights   11/06/23 0948  Weight: 131.5 kg    Scheduled Meds:  doxycycline  100 mg Oral Q12H   famotidine  20 mg Oral Daily   heparin  5,000 Units Subcutaneous Q8H   oseltamivir  30 mg Oral Daily   pantoprazole  40 mg Oral QAC breakfast   Continuous Infusions:  sodium chloride     cefTRIAXone (ROCEPHIN)  IV      Nutritional status     Body mass index is 34.39 kg/m.  Data Reviewed:   CBC: Recent Labs  Lab 11/06/23 1017 11/07/23  0359  WBC 7.9 7.9  HGB 14.5 12.4*  HCT 42.9 36.8*  MCV 96.8 98.4  PLT 285 236   Basic Metabolic Panel: Recent Labs  Lab 11/06/23 1017 11/06/23 1708 11/07/23 0359  NA 138 138 138  K 3.7 3.7 4.2  CL 101 103 105  CO2 20* 24 22  GLUCOSE 117* 106* 96  BUN 32* 32* 33*  CREATININE 6.90* 7.14* 7.56*  CALCIUM 9.3 8.7* 8.8*  MG 2.0  --   --    GFR: Estimated Creatinine Clearance: 20.9 mL/min (A) (by C-G formula based on SCr of 7.56 mg/dL (H)). Liver Function Tests: Recent Labs  Lab 11/06/23 1017  AST 31  ALT 21  ALKPHOS 59  BILITOT 0.5  PROT 7.1  ALBUMIN 4.2   Recent Labs  Lab 11/06/23 1017  LIPASE 34   No results for input(s): "AMMONIA" in the last 168  hours. Coagulation Profile: No results for input(s): "INR", "PROTIME" in the last 168 hours. Cardiac Enzymes: Recent Labs  Lab 11/06/23 1017  CKTOTAL 1,408*   BNP (last 3 results) No results for input(s): "PROBNP" in the last 8760 hours. HbA1C: No results for input(s): "HGBA1C" in the last 72 hours. CBG: No results for input(s): "GLUCAP" in the last 168 hours. Lipid Profile: No results for input(s): "CHOL", "HDL", "LDLCALC", "TRIG", "CHOLHDL", "LDLDIRECT" in the last 72 hours. Thyroid Function Tests: No results for input(s): "TSH", "T4TOTAL", "FREET4", "T3FREE", "THYROIDAB" in the last 72 hours. Anemia Panel: No results for input(s): "VITAMINB12", "FOLATE", "FERRITIN", "TIBC", "IRON", "RETICCTPCT" in the last 72 hours. Sepsis Labs: No results for input(s): "PROCALCITON", "LATICACIDVEN" in the last 168 hours.  Recent Results (from the past 240 hours)  Resp panel by RT-PCR (RSV, Flu A&B, Covid) Anterior Nasal Swab     Status: Abnormal   Collection Time: 11/06/23 10:24 AM   Specimen: Anterior Nasal Swab  Result Value Ref Range Status   SARS Coronavirus 2 by RT PCR NEGATIVE NEGATIVE Final    Comment: (NOTE) SARS-CoV-2 target nucleic acids are NOT DETECTED.  The SARS-CoV-2 RNA is generally detectable in upper respiratory specimens during the acute phase of infection. The lowest concentration of SARS-CoV-2 viral copies this assay can detect is 138 copies/mL. A negative result does not preclude SARS-Cov-2 infection and should not be used as the sole basis for treatment or other patient management decisions. A negative result may occur with  improper specimen collection/handling, submission of specimen other than nasopharyngeal swab, presence of viral mutation(s) within the areas targeted by this assay, and inadequate number of viral copies(<138 copies/mL). A negative result must be combined with clinical observations, patient history, and epidemiological information. The expected  result is Negative.  Fact Sheet for Patients:  BloggerCourse.com  Fact Sheet for Healthcare Providers:  SeriousBroker.it  This test is no t yet approved or cleared by the [place] FDA and  has been authorized for detection and/or diagnosis of SARS-CoV-2 by FDA under an Emergency Use Authorization (EUA). This EUA will remain  in effect (meaning this test can be used) for the duration of the COVID-19 declaration under Section 564(b)(1) of the Act, 21 U.S.C.section 360bbb-3(b)(1), unless the authorization is terminated  or revoked sooner.       Influenza A by PCR POSITIVE (A) NEGATIVE Final   Influenza B by PCR NEGATIVE NEGATIVE Final    Comment: (NOTE) The Xpert Xpress SARS-CoV-2/FLU/RSV plus assay is intended as an aid in the diagnosis of influenza from Nasopharyngeal swab specimens and should not be used as  a sole basis for treatment. Nasal washings and aspirates are unacceptable for Xpert Xpress SARS-CoV-2/FLU/RSV testing.  Fact Sheet for Patients: BloggerCourse.com  Fact Sheet for Healthcare Providers: SeriousBroker.it  This test is not yet approved or cleared by the [place] FDA and has been authorized for detection and/or diagnosis of SARS-CoV-2 by FDA under an Emergency Use Authorization (EUA). This EUA will remain in effect (meaning this test can be used) for the duration of the COVID-19 declaration under Section 564(b)(1) of the Act, 21 U.S.C. section 360bbb-3(b)(1), unless the authorization is terminated or revoked.     Resp Syncytial Virus by PCR NEGATIVE NEGATIVE Final    Comment: (NOTE) Fact Sheet for Patients: BloggerCourse.com  Fact Sheet for Healthcare Providers: SeriousBroker.it  This test is not yet approved or cleared by the [place] FDA and has been authorized for detection and/or  diagnosis of SARS-CoV-2 by FDA under an Emergency Use Authorization (EUA). This EUA will remain in effect (meaning this test can be used) for the duration of the COVID-19 declaration under Section 564(b)(1) of the Act, 21 U.S.C. section 360bbb-3(b)(1), unless the authorization is terminated or revoked.  Performed at [facility], [address], [place], [place] [address]          Radiology Studies: CT ABDOMEN PELVIS WO CONTRAST Result Date: 11/06/2023 CLINICAL DATA:  Abdominal pain EXAM: CT ABDOMEN AND PELVIS WITHOUT CONTRAST TECHNIQUE: Multidetector CT imaging of the abdomen and pelvis was performed following the standard protocol without IV contrast. RADIATION DOSE REDUCTION: This exam was performed according to the departmental dose-optimization program which includes automated exposure control, adjustment of the mA and/or kV according to patient size and/or use of iterative reconstruction technique. COMPARISON:  04/24/2015. FINDINGS: Lower chest: Clear.  No pericardial or pleural effusions. Hepatobiliary: No focal liver abnormality is seen. No gallstones, gallbladder wall thickening, or biliary dilatation. Pancreas: Unremarkable. No pancreatic ductal dilatation or surrounding inflammatory changes. Spleen: Normal in size without focal abnormality. Adrenals/Urinary Tract: Adrenal glands are unremarkable. Mild perinephric fat stranding, right more severe than the. This may indicate pyelonephritis and correlation with urinalysis recommended. No hydronephrosis or nephrolithiasis. Unremarkable urinary bladder. Stomach/Bowel: Stomach is within normal limits. Appendix appears to have been removed and no evidence of appendicitis. No evidence of bowel wall thickening, distention, or inflammatory changes. Vascular/Lymphatic: No significant vascular findings are present. No enlarged abdominal or pelvic lymph nodes. Reproductive: Prostate is unremarkable. Other: No abdominal wall hernia  or abnormality. No abdominopelvic ascites. Musculoskeletal: No acute or significant osseous findings. IMPRESSION: 1. Perinephric fat stranding, right more severe than the left. This may indicate pyelonephritis and correlation with urinalysis recommended. 2. Otherwise no acute abdominal or pelvic pathology identified. Electronically Signed   By: Layla Maw M.D.   On: 11/06/2023 12:55   US SCROTUM W/DOPPLER Result Date: 11/06/2023 CLINICAL DATA:  pain left testicle for 4 days EXAM: SCROTAL ULTRASOUND DOPPLER ULTRASOUND OF THE TESTICLES TECHNIQUE: Complete ultrasound examination of the testicles, epididymis, and other scrotal structures was performed. Color and spectral Doppler ultrasound were also utilized to evaluate blood flow to the testicles. COMPARISON:  None Available. FINDINGS: Evaluation is limited by patient motion. Right testicle Measurements: 3.7 x 2.3 x 3.1 cm. No mass or microlithiasis visualized. Left testicle Measurements: 4.0 x 2.1 x 2.6 cm. No mass or microlithiasis visualized. Mildly heterogeneous in appearance with suggestion of underlying edema. Right epididymis:  Normal in size and appearance. Left epididymis:  Normal in size and appearance. Hydrocele:  Trace bilateral peritesticular hydroceles. Varicocele:  None visualized. Pulsed Doppler interrogation of both testes demonstrates normal low resistance arterial and venous waveforms bilaterally. IMPRESSION: 1. Evaluation is limited by patient motion. Within this limitation, there is mild heterogeneity of the left testicle with suggestion of underlying edema. This is nonspecific but could reflect orchitis. 2. No sonographic evidence of testicular torsion. Electronically Signed   By: Meda Klinefelter M.D.   On: 11/06/2023 11:33   DG Chest Port 1 View Result Date: 11/06/2023 CLINICAL DATA:  Chest pain. EXAM: PORTABLE CHEST 1 VIEW COMPARISON:  02/18/2022 FINDINGS: Heart size and mediastinal contours are normal. No pleural fluid,  interstitial edema, or airspace consolidation. Postoperative changes in the lower cervical spine. Visualized osseous structures appear intact. IMPRESSION: No acute cardiopulmonary disease. Electronically Signed   By: Signa Kell M.D.   On: 11/06/2023 11:15           LOS: 1 day   Time spent= 35 mins    Miguel Rota, MD [facility]  If 7PM-7AM, please contact night-coverage  11/07/2023, 9:51 AM

## 2023-11-07 NOTE — Plan of Care (Signed)

## 2023-11-07 NOTE — Plan of Care (Signed)

## 2023-11-07 NOTE — Progress Notes (Signed)
   11/07/23 1131  TOC Brief Assessment  Insurance and Status Lapsed  Patient has primary care physician No (Appointment scheduled at [facility] for 11/23/23 at 9:20am. Added to AVS.)  Home environment has been reviewed Single family home  Prior level of function: Independent  Prior/Current Home Services No current home services  Social Drivers of Health Review SDOH reviewed no interventions necessary  Readmission risk has been reviewed Yes  Transition of care needs no transition of care needs at this time

## 2023-11-08 ENCOUNTER — Other Ambulatory Visit (HOSPITAL_COMMUNITY): Payer: PRIVATE HEALTH INSURANCE

## 2023-11-08 ENCOUNTER — Encounter (HOSPITAL_COMMUNITY): Payer: Self-pay | Admitting: Internal Medicine

## 2023-11-08 LAB — URINALYSIS, ROUTINE W REFLEX MICROSCOPIC
Bacteria, UA: NONE SEEN
Bilirubin Urine: NEGATIVE
Glucose, UA: NEGATIVE mg/dL
Ketones, ur: NEGATIVE mg/dL
Leukocytes,Ua: NEGATIVE
Nitrite: NEGATIVE
Protein, ur: 30 mg/dL — AB
Specific Gravity, Urine: 1.005 (ref 1.005–1.030)
pH: 6 (ref 5.0–8.0)

## 2023-11-08 LAB — CBC
HCT: 36.6 % — ABNORMAL LOW (ref 39.0–52.0)
Hemoglobin: 12.3 g/dL — ABNORMAL LOW (ref 13.0–17.0)
MCH: 33 pg (ref 26.0–34.0)
MCHC: 33.6 g/dL (ref 30.0–36.0)
MCV: 98.1 fL (ref 80.0–100.0)
Platelets: 240 10*3/uL (ref 150–400)
RBC: 3.73 MIL/uL — ABNORMAL LOW (ref 4.22–5.81)
RDW: 12.4 % (ref 11.5–15.5)
WBC: 7.1 10*3/uL (ref 4.0–10.5)
nRBC: 0 % (ref 0.0–0.2)

## 2023-11-08 LAB — BASIC METABOLIC PANEL
Anion gap: 11 (ref 5–15)
BUN: 33 mg/dL — ABNORMAL HIGH (ref 6–20)
CO2: 20 mmol/L — ABNORMAL LOW (ref 22–32)
Calcium: 8.7 mg/dL — ABNORMAL LOW (ref 8.9–10.3)
Chloride: 105 mmol/L (ref 98–111)
Creatinine, Ser: 8.35 mg/dL — ABNORMAL HIGH (ref 0.61–1.24)
GFR, Estimated: 8 mL/min — ABNORMAL LOW (ref >60)
Glucose, Bld: 111 mg/dL — ABNORMAL HIGH (ref 70–99)
Potassium: 3.8 mmol/L (ref 3.5–5.1)
Sodium: 136 mmol/L (ref 135–145)

## 2023-11-08 LAB — MAGNESIUM: Magnesium: 1.8 mg/dL (ref 1.7–2.4)

## 2023-11-08 LAB — GC/CHLAMYDIA PROBE AMP (~~LOC~~) NOT AT ARMC
Chlamydia: NEGATIVE
Comment: NEGATIVE
Comment: NORMAL
Neisseria Gonorrhea: NEGATIVE

## 2023-11-08 LAB — SODIUM, URINE, RANDOM: Sodium, Ur: 35 mmol/L

## 2023-11-08 LAB — CREATININE, URINE, RANDOM: Creatinine, Urine: 112 mg/dL

## 2023-11-08 LAB — CK: Total CK: 1035 U/L — ABNORMAL HIGH (ref 49–397)

## 2023-11-08 MED ORDER — SODIUM CHLORIDE 0.9 % IV SOLN: INTRAVENOUS | Status: DC

## 2023-11-08 MED ORDER — FAMOTIDINE 20 MG PO TABS: 20.0000 mg | ORAL_TABLET | Freq: Two times a day (BID) | ORAL | Status: DC | PRN

## 2023-11-08 MED ORDER — SUCRALFATE 1 GM/10ML PO SUSP
1.0000 g | Freq: Three times a day (TID) | ORAL | Status: DC
  Administered 2023-11-08 (×2): 1 g via ORAL
  Filled 2023-11-08 (×2): qty 10

## 2023-11-08 MED ORDER — LACTATED RINGERS IV BOLUS
3000.0000 mL | Freq: Once | INTRAVENOUS | Status: AC
Start: 2023-11-08 — End: 2023-11-08
  Administered 2023-11-08: 3000 mL via INTRAVENOUS

## 2023-11-08 MED ORDER — HYDROXYZINE HCL 10 MG PO TABS
10.0000 mg | ORAL_TABLET | Freq: Once | ORAL | Status: AC | PRN
  Administered 2023-11-08: 10 mg via ORAL
  Filled 2023-11-08: qty 1

## 2023-11-08 NOTE — Progress Notes (Signed)
 Chaplain met with Derek Sullivan and his girlfriend to provide support. They are frustrated because they feel like he continues to get lab draws, but they haven't been shown any results and don't know why they continue to give him the same medication if it is not making a difference.  He feels that his body is rejecting the medication because he feels worse than he did when he came in.  He feels that he is just being "doped up" and just wants to go home and have a good meal.  He feels that his body can fight this off, but that if it can't, then it is his time to go.  He has had passive SI prior to the hospitalization, but no plan or specific intent. He feels his statement about "going home and letting the flu take him out" was taken out of context and he that he is not suicidal.  They want to speak with a doctor and explore outpatient treatment options, which he said he would follow through on. They have some family history that is leading to significant mistrust of the medical system. Chaplain provided listening, clarifying questions and emotional support.  Chaplain will continue to work with multidisciplinary team to provide support to [place] and his girlfriend as needs arise.  [address] Bcc Pager, [phone]

## 2023-11-08 NOTE — Progress Notes (Addendum)
 Patient expressed directly to the nursing staff the intent to leave AMA "I am thinking about killing myself by just letting the flu take me out."   Chinita Greenland, NP notified Psych consult ordered  [facility] consult ordered   Sitter in at bedside See [facility] for new meds.  Education provided on the risk and benefits associated with leaving against the advice of the attending physician. Patient agreed to stay, awaiting Chaplin visit. Safety measures in place to ensure patients safety. Will report off to the oncoming nurse.

## 2023-11-08 NOTE — Progress Notes (Signed)
 PROGRESS NOTE    YOAV OKANE  ZOX:[id number] DOB: March 16, 1991 DOA: 11/06/2023 PCP: Patient, No Pcp Per    Brief Narrative:   33 year old with history of HTN, depression not on medication comes to the hospital with influenza A and acute renal failure.  Has been feeling sick for the past 4 days.  Upon admission noted to have AKI  Assessment & Plan:  Principal Problem:   AKI (acute kidney injury) (HCC)   Acute kidney injury Acute pyelonephritis - Baseline creatinine in 2023 was 1.0, admission creatinine 7.14, continues to trend up.  CT scan initially showed possible pyelonephritis, UA is negative.  Will repeated along with urine cultures.  Renal ultrasound is also unremarkable.  Due to rising creatinine, nephrology has been consulted.  Will continue aggressive IV fluids at this time.  If it fails to improve, we can proceed with further SPEP/ANA or even renal biopsy. - Currently patient is declining multiple therapies despite of understanding his condition.  His significant other is also present at bedside.  Mild rhabdomyolysis - IV fluids.  Trend CK levels.  Abd pain, unspecified -Lipase is normal, LFTs normal.  I explained this could be related to gastritis/ulcer.  He continues to decline and is difficult about taking his PPI/Carafate/Pepcid.  Influenza A - On Tamiflu, aggressive hydration  Left testicular pain - Declining any further antibiotics at this time.  Will discontinue it.  He understands that very well he could have underlying infection.  HIV is negative, GC chlamydia is pending.  DVT prophylaxis: SQ Heparin    Code Status: Full Code Family Communication:   Status is: Inpatient Remains inpatient appropriate because: Cont hosp stay for AKI    Subjective: Met with patient and his girlfriend at bedside.  He is very resistant to multiple treatment asking multiple questions and very upset that he does not want to take medications but at the same time once his  abdominal pain/stomach ache to go away.  Continue to decline his care intermittently at this time.  He understands his blood pressure is running high as well.  Declining subcutaneous heparin.  Patient's RN is in the room with me.  Examination:  General exam: Appears calm and comfortable  Respiratory system: Clear to auscultation. Respiratory effort normal. Cardiovascular system: S1 & S2 heard, RRR. No JVD, murmurs, rubs, gallops or clicks. No pedal edema. Gastrointestinal system: Abdomen is nondistended, soft and nontender. No organomegaly or masses felt. Normal bowel sounds heard. Central nervous system: Alert and oriented. No focal neurological deficits. Extremities: Symmetric 5 x 5 power. Skin: No rashes, lesions or ulcers Psychiatry: Judgement and insight appear normal. Mood & affect appropriate.                Diet Orders (From admission, onward)     Start     Ordered   11/06/23 1346  Diet regular Room service appropriate? Yes; Fluid consistency: Thin  Diet effective now       Question Answer Comment  Room service appropriate? Yes   Fluid consistency: Thin      11/06/23 1347            Objective: Vitals:   11/08/23 0433 11/08/23 0610 11/08/23 0800 11/08/23 1146  BP: (!) 190/118 (!) 168/101 (!) 183/112   Pulse: 64  67   Resp: 16  18   Temp: 98 F (36.7 C)  98.7 F (37.1 C)   TempSrc: Oral  Oral   SpO2: 95%  98%   Weight:    Marland Kitchen)  141.6 kg  Height:        Intake/Output Summary (Last 24 hours) at 11/08/2023 1403 Last data filed at 11/08/2023 1233 Gross per 24 hour  Intake 2185.29 ml  Output 1550 ml  Net 635.29 ml   Filed Weights   11/06/23 0948 11/08/23 1146  Weight: 131.5 kg (!) 141.6 kg    Scheduled Meds:  oseltamivir  30 mg Oral Daily   sucralfate  1 g Oral TID WC & HS   Continuous Infusions:  sodium chloride      Nutritional status     Body mass index is 37.01 kg/m.  Data Reviewed:   CBC: Recent Labs  Lab 11/06/23 1017  11/07/23 0359 11/08/23 0458  WBC 7.9 7.9 7.1  HGB 14.5 12.4* 12.3*  HCT 42.9 36.8* 36.6*  MCV 96.8 98.4 98.1  PLT 285 236 240   Basic Metabolic Panel: Recent Labs  Lab 11/06/23 1017 11/06/23 1708 11/07/23 0359 11/08/23 0458  NA 138 138 138 136  K 3.7 3.7 4.2 3.8  CL 101 103 105 105  CO2 20* 24 22 20*  GLUCOSE 117* 106* 96 111*  BUN 32* 32* 33* 33*  CREATININE 6.90* 7.14* 7.56* 8.35*  CALCIUM 9.3 8.7* 8.8* 8.7*  MG 2.0  --   --  1.8   GFR: Estimated Creatinine Clearance: 19.6 mL/min (A) (by C-G formula based on SCr of 8.35 mg/dL (H)). Liver Function Tests: Recent Labs  Lab 11/06/23 1017  AST 31  ALT 21  ALKPHOS 59  BILITOT 0.5  PROT 7.1  ALBUMIN 4.2   Recent Labs  Lab 11/06/23 1017 11/07/23 1253  LIPASE 34 26   No results for input(s): "AMMONIA" in the last 168 hours. Coagulation Profile: No results for input(s): "INR", "PROTIME" in the last 168 hours. Cardiac Enzymes: Recent Labs  Lab 11/06/23 1017 11/07/23 1253 11/08/23 0458  CKTOTAL 1,408* 1,119* 1,035*   BNP (last 3 results) No results for input(s): "PROBNP" in the last 8760 hours. HbA1C: No results for input(s): "HGBA1C" in the last 72 hours. CBG: No results for input(s): "GLUCAP" in the last 168 hours. Lipid Profile: No results for input(s): "CHOL", "HDL", "LDLCALC", "TRIG", "CHOLHDL", "LDLDIRECT" in the last 72 hours. Thyroid Function Tests: No results for input(s): "TSH", "T4TOTAL", "FREET4", "T3FREE", "THYROIDAB" in the last 72 hours. Anemia Panel: No results for input(s): "VITAMINB12", "FOLATE", "FERRITIN", "TIBC", "IRON", "RETICCTPCT" in the last 72 hours. Sepsis Labs: No results for input(s): "PROCALCITON", "LATICACIDVEN" in the last 168 hours.  Recent Results (from the past 240 hours)  Resp panel by RT-PCR (RSV, Flu A&B, Covid) Anterior Nasal Swab     Status: Abnormal   Collection Time: 11/06/23 10:24 AM   Specimen: Anterior Nasal Swab  Result Value Ref Range Status   SARS  Coronavirus 2 by RT PCR NEGATIVE NEGATIVE Final    Comment: (NOTE) SARS-CoV-2 target nucleic acids are NOT DETECTED.  The SARS-CoV-2 RNA is generally detectable in upper respiratory specimens during the acute phase of infection. The lowest concentration of SARS-CoV-2 viral copies this assay can detect is 138 copies/mL. A negative result does not preclude SARS-Cov-2 infection and should not be used as the sole basis for treatment or other patient management decisions. A negative result may occur with  improper specimen collection/handling, submission of specimen other than nasopharyngeal swab, presence of viral mutation(s) within the areas targeted by this assay, and inadequate number of viral copies(<138 copies/mL). A negative result must be combined with clinical observations, patient history, and epidemiological information. The  expected result is Negative.  Fact Sheet for Patients:  BloggerCourse.com  Fact Sheet for Healthcare Providers:  SeriousBroker.it  This test is no t yet approved or cleared by the [place] FDA and  has been authorized for detection and/or diagnosis of SARS-CoV-2 by FDA under an Emergency Use Authorization (EUA). This EUA will remain  in effect (meaning this test can be used) for the duration of the COVID-19 declaration under Section 564(b)(1) of the Act, 21 U.S.C.section 360bbb-3(b)(1), unless the authorization is terminated  or revoked sooner.       Influenza A by PCR POSITIVE (A) NEGATIVE Final   Influenza B by PCR NEGATIVE NEGATIVE Final    Comment: (NOTE) The Xpert Xpress SARS-CoV-2/FLU/RSV plus assay is intended as an aid in the diagnosis of influenza from Nasopharyngeal swab specimens and should not be used as a sole basis for treatment. Nasal washings and aspirates are unacceptable for Xpert Xpress SARS-CoV-2/FLU/RSV testing.  Fact Sheet for  Patients: BloggerCourse.com  Fact Sheet for Healthcare Providers: SeriousBroker.it  This test is not yet approved or cleared by the [place] FDA and has been authorized for detection and/or diagnosis of SARS-CoV-2 by FDA under an Emergency Use Authorization (EUA). This EUA will remain in effect (meaning this test can be used) for the duration of the COVID-19 declaration under Section 564(b)(1) of the Act, 21 U.S.C. section 360bbb-3(b)(1), unless the authorization is terminated or revoked.     Resp Syncytial Virus by PCR NEGATIVE NEGATIVE Final    Comment: (NOTE) Fact Sheet for Patients: BloggerCourse.com  Fact Sheet for Healthcare Providers: SeriousBroker.it  This test is not yet approved or cleared by the [place] FDA and has been authorized for detection and/or diagnosis of SARS-CoV-2 by FDA under an Emergency Use Authorization (EUA). This EUA will remain in effect (meaning this test can be used) for the duration of the COVID-19 declaration under Section 564(b)(1) of the Act, 21 U.S.C. section 360bbb-3(b)(1), unless the authorization is terminated or revoked.  Performed at [facility], [address] [place], [place] [address]   Urine Culture     Status: Abnormal   Collection Time: 11/06/23 11:42 AM   Specimen: Urine, Clean Catch  Result Value Ref Range Status   Specimen Description   Final    URINE, CLEAN CATCH Performed at [facility], [address], [place], [place] [address]    Special Requests   Final    NONE Performed at [facility], [address], [place], [place] [address]    Culture (A)  Final    <10,000 COLONIES/mL INSIGNIFICANT GROWTH Performed at [facility], [address] [place], [place] [address]    Report Status 11/07/2023 FINAL  Final         Radiology Studies: US  RENAL Result Date: 11/07/2023 CLINICAL DATA:  [id number] AKI (acute kidney injury) (HCC) [id number] EXAM: RENAL / URINARY TRACT ULTRASOUND COMPLETE COMPARISON:  11/06/23. FINDINGS: Right Kidney: Renal measurements: 14.2 x 6.6 x 7.4 cm = volume: 360 mL. Echogenicity is the upper limits of normal. No mass or hydronephrosis visualized. Left Kidney: Renal measurements: 14.1 x 7.0 x 7.3 cm = volume: 377 mL. Echogenicity is the upper limits of normal. No mass or hydronephrosis visualized. Bladder: Appears normal for degree of bladder distention. Other: None. IMPRESSION: No hydronephrosis. Electronically Signed   By: Meda Klinefelter M.D.   On: 11/07/2023 10:46           LOS: 2 days  Time spent= 35 mins    Miguel Rota, MD [facility]  If 7PM-7AM, please contact night-coverage  11/08/2023, 2:03 PM
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
## 2023-11-08 NOTE — Progress Notes (Addendum)
       Overnight   NAME: Derek Sullivan MRN: [id number] DOB : 1991/04/02    Date of Service   11/08/2023   HPI/Events of Note    Notified by RN for patient intent to leave AMA.  During conversation with RN, patient stated directly to the RN "Expressed directly to the RN after stated intent to AMA  " I am thinking about killing myself by just letting the flu take me out"   Psych consult is ordered Ophelia Charter is ordered.    Interventions/ Plan   Psych consult ordered. Sitter ordered for SI. Pain has been addressed with medication currently. Anxiety has been addressed with medication currently.      Chinita Greenland BSN MSNA MSN ACNPC-AG Acute Care Nurse Practitioner [facility]
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
## 2023-11-08 NOTE — Progress Notes (Signed)
 1445: Pt became agitated, threatening to leave AMA. Verbalized frustrations about treatment plan. BP 185/120, declining PRN hydralazine. Disconnect infusing IV fluids. Security paged to bedside.  Psych and hospitalist at bedside to talk with patient.   1608: Pt decides to leave against medical advice. Educated on risks involved to acknowledgement.

## 2023-11-08 NOTE — Plan of Care (Signed)
  Problem: Clinical Measurements: Goal: Will remain free from infection Outcome: Progressing Goal: Diagnostic test results will improve Outcome: Progressing   Problem: Coping: Goal: Level of anxiety will decrease Outcome: Progressing   

## 2023-11-08 NOTE — Discharge Instructions (Addendum)
 Dear Derek Sullivan,  It was a pleasure to meet you, and I am sorry that this has been such a tough experience.   Most effective treatment for your mental health disease involves BOTH a psychiatrist AND a therapist Psychiatrist to manage medications Therapist to help identify personal goals, barriers from those goals, and plan to achieve those goals by understanding emotions Please make regular appointments with an outpatient psychiatrist and other doctors once you leave the hospital (if any, otherwise, please see below for resources to make an appointment).  For therapy outside the hospital, please ask for these specific types of therapy: DBT ________________________________________________________  SAFETY CRISIS  Dial 988 for National Suicide & Crisis Lifeline    Text [phone] [phone] for Crisis Text Line:     [facility] URGENT CARE:  [address] FIRST FLOOR.  [place], [place] [address].  [phone]  Mobile Crisis Response Teams Listed by counties in vicinity of [facility] providers [facility] [phone] [facility] [phone] [facility] [phone] [facility] [phone] [place]                * [facility] [phone]                * [facility] [phone] [facility] [phone] [facility]  [phone] * [facility] [phone] ________________________________________________________  To see which pharmacy near you is the CHEAPEST for certain medications, please use GoodRx. It is free website and has a free phone app.    Also consider looking at [facility] $4.00 or [facility]'s $7.00 prescription list. Both are free to view if googled "[facility]4 prescription" and "[facility] $7 prescription". These are  set prices, no insurance required. [facility] low cost medications: $4-$15 for 30days prescriptions or $10-$38 for 90days prescriptions  ________________________________________________________  Difficulties with sleep?   Can also use this free app for insomnia called CBT-I. Let your doctors and therapists know so they can help with extra tips and tricks or for guidance and accountability. NO ADDS on the app.     ________________________________________________________  Non-Emergent / Urgent  [facility] [address], SECOND FLOOR [place], [place] [address] [phone] OUTPATIENT Walk-in information: Please note, all walk-ins are first come & first serve, with limited number of availability.  Please note that to be eligible for services you must bring: ID or a piece of mail with your name [facility] address  Therapist for therapy:  Monday & Wednesdays: Please ARRIVE at 7:15 AM for registration Will START at 8:00 AM Every 1st & 2nd Friday of the month: Please ARRIVE at 10:15 AM for registration Will START at 1 PM - 5 PM  Psychiatrist for medication management: Monday - Friday:  Please ARRIVE at 7:15 AM for registration Will START at 8:00 AM  Regretfully, due to limited availability, please be aware that you may not been seen on the same day as walk-in. Please consider making an appoint or try again. Thank you for your patience and understanding. It was good to meet you.  Outpatient Therapy and Psychiatry Resources for Patients: Here are a series of links for finding a therapist.    Includes links to the following: [facility] (http://wilson-mayo.com/) (only for [facility] and please reserve for uninsured) [facility] [place]  (http://[place]ens[place]-martinez.net/) Psychology Today Special educational needs teacher (https://www.psychologytoday.com/us/therapists) Psychology Today Support Group Finder (https://www.psychologytoday.com/us/groups/) [place]  [facility] Location (https://carolinabehavioralcare.com/staff-location/[place]/) [facility] [place] - Support Group Finder - (RecordDebt.fi) [facility] [employer] - Lexicographer (https://fspcares.org/contact/) [employer] for Mental Health [place] - [facility] (https://namigu[place].org/support-and-education/support-groups/) Interior and spatial designer Health - Affiliated with [facility] (https://www.[place].com/lb/locations/profile/[facility]-[address]/) Dept of Health and Human Services - Find a mental health facility (http://lester.info/)  -Dr Weston Settle, Psychiatry

## 2023-11-09 LAB — URINE CULTURE: Culture: NO GROWTH

## 2023-11-23 ENCOUNTER — Ambulatory Visit (INDEPENDENT_AMBULATORY_CARE_PROVIDER_SITE_OTHER): Payer: PRIVATE HEALTH INSURANCE | Admitting: Family

## 2023-11-23 VITALS — BP 160/100 | HR 97 | Temp 98.6°F | Resp 18 | Ht 77.0 in | Wt 303.4 lb

## 2023-11-23 DIAGNOSIS — R202 Paresthesia of skin: Secondary | ICD-10-CM

## 2023-11-23 DIAGNOSIS — F17209 Nicotine dependence, unspecified, with unspecified nicotine-induced disorders: Secondary | ICD-10-CM

## 2023-11-23 DIAGNOSIS — M542 Cervicalgia: Secondary | ICD-10-CM

## 2023-11-23 DIAGNOSIS — F5101 Primary insomnia: Secondary | ICD-10-CM | POA: Diagnosis not present

## 2023-11-23 DIAGNOSIS — I10 Essential (primary) hypertension: Secondary | ICD-10-CM

## 2023-11-23 DIAGNOSIS — F411 Generalized anxiety disorder: Secondary | ICD-10-CM | POA: Diagnosis not present

## 2023-11-23 DIAGNOSIS — Z2821 Immunization not carried out because of patient refusal: Secondary | ICD-10-CM | POA: Diagnosis not present

## 2023-11-23 DIAGNOSIS — R200 Anesthesia of skin: Secondary | ICD-10-CM

## 2023-11-23 DIAGNOSIS — Z1159 Encounter for screening for other viral diseases: Secondary | ICD-10-CM

## 2023-11-23 DIAGNOSIS — N179 Acute kidney failure, unspecified: Secondary | ICD-10-CM

## 2023-11-23 HISTORY — DX: Essential (primary) hypertension: I10

## 2023-11-23 MED ORDER — AMLODIPINE BESYLATE 10 MG PO TABS: 10.0000 mg | ORAL_TABLET | Freq: Every day | ORAL | 1 refills | Status: AC

## 2023-11-23 MED ORDER — HYDROXYZINE HCL 50 MG PO TABS: 50.0000 mg | ORAL_TABLET | Freq: Three times a day (TID) | ORAL | 1 refills | Status: AC | PRN

## 2023-11-23 MED ORDER — NICOTINE 7 MG/24HR TD PT24
7.0000 mg | MEDICATED_PATCH | Freq: Every day | TRANSDERMAL | 0 refills | Status: DC
Start: 2023-11-23 — End: 2023-12-15

## 2023-11-23 MED ORDER — TRAZODONE HCL 50 MG PO TABS: 50.0000 mg | ORAL_TABLET | Freq: Every evening | ORAL | 1 refills | Status: DC | PRN
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
## 2023-12-07 ENCOUNTER — Encounter: Payer: Self-pay | Admitting: Family

## 2023-12-07 NOTE — Progress Notes (Signed)
   This encounter was created in error - please disregard. No show

## 2023-12-15 ENCOUNTER — Ambulatory Visit (INDEPENDENT_AMBULATORY_CARE_PROVIDER_SITE_OTHER): Admitting: Family

## 2023-12-15 ENCOUNTER — Encounter: Payer: Self-pay | Admitting: Family

## 2023-12-15 VITALS — BP 140/100 | HR 91 | Temp 98.0°F | Resp 20 | Ht 77.0 in | Wt 303.2 lb

## 2023-12-15 DIAGNOSIS — I10 Essential (primary) hypertension: Secondary | ICD-10-CM

## 2023-12-15 MED ORDER — ASPIRIN 81 MG PO TBEC: 81.0000 mg | DELAYED_RELEASE_TABLET | Freq: Every day | ORAL | Status: AC

## 2023-12-15 MED ORDER — HYDROCHLOROTHIAZIDE 12.5 MG PO TABS: 12.5000 mg | ORAL_TABLET | Freq: Every day | ORAL | 3 refills | Status: AC
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
## 2023-12-29 ENCOUNTER — Encounter: Payer: PRIVATE HEALTH INSURANCE | Admitting: Family

## 2023-12-29 NOTE — Progress Notes (Signed)
   This encounter was created in error - please disregard. No show
